# Patient Record
Sex: Female | Born: 1965 | Race: Black or African American | Hispanic: No | Marital: Single | Smoking: Former smoker
Health system: Southern US, Community
[De-identification: ages and names within clinical notes are randomized; demographics above are authoritative.]

## PROBLEM LIST (undated history)

## (undated) DIAGNOSIS — J45909 Unspecified asthma, uncomplicated: Secondary | ICD-10-CM

## (undated) DIAGNOSIS — Z21 Asymptomatic human immunodeficiency virus [HIV] infection status: Secondary | ICD-10-CM

## (undated) DIAGNOSIS — B20 Human immunodeficiency virus [HIV] disease: Secondary | ICD-10-CM

## (undated) DIAGNOSIS — T7840XA Allergy, unspecified, initial encounter: Secondary | ICD-10-CM

## (undated) DIAGNOSIS — B1920 Unspecified viral hepatitis C without hepatic coma: Secondary | ICD-10-CM

## (undated) DIAGNOSIS — E119 Type 2 diabetes mellitus without complications: Secondary | ICD-10-CM

## (undated) DIAGNOSIS — F32A Depression, unspecified: Secondary | ICD-10-CM

## (undated) DIAGNOSIS — K219 Gastro-esophageal reflux disease without esophagitis: Secondary | ICD-10-CM

## (undated) DIAGNOSIS — N189 Chronic kidney disease, unspecified: Secondary | ICD-10-CM

## (undated) DIAGNOSIS — G4730 Sleep apnea, unspecified: Secondary | ICD-10-CM

## (undated) DIAGNOSIS — M1990 Unspecified osteoarthritis, unspecified site: Secondary | ICD-10-CM

## (undated) DIAGNOSIS — F419 Anxiety disorder, unspecified: Secondary | ICD-10-CM

## (undated) DIAGNOSIS — F329 Major depressive disorder, single episode, unspecified: Secondary | ICD-10-CM

## (undated) DIAGNOSIS — G5600 Carpal tunnel syndrome, unspecified upper limb: Secondary | ICD-10-CM

## (undated) HISTORY — DX: Asymptomatic human immunodeficiency virus (hiv) infection status: Z21

## (undated) HISTORY — DX: Chronic kidney disease, unspecified: N18.9

## (undated) HISTORY — PX: SMALL BOWEL ENTEROSCOPY: SHX2415

## (undated) HISTORY — DX: Allergy, unspecified, initial encounter: T78.40XA

## (undated) HISTORY — DX: Sleep apnea, unspecified: G47.30

## (undated) HISTORY — DX: Gastro-esophageal reflux disease without esophagitis: K21.9

## (undated) HISTORY — PX: FRACTURE SURGERY: SHX138

## (undated) HISTORY — DX: Type 2 diabetes mellitus without complications: E11.9

## (undated) HISTORY — DX: Human immunodeficiency virus (HIV) disease: B20

## (undated) HISTORY — PX: ABDOMINAL HYSTERECTOMY: SHX81

## (undated) HISTORY — PX: FOOT SURGERY: SHX648

## (undated) HISTORY — PX: TONSILLECTOMY: SUR1361

## (undated) HISTORY — DX: Anxiety disorder, unspecified: F41.9

---

## 1998-10-26 ENCOUNTER — Emergency Department (HOSPITAL_COMMUNITY): Admission: EM | Admit: 1998-10-26 | Discharge: 1998-10-26 | Payer: Self-pay | Admitting: Emergency Medicine

## 1998-10-27 ENCOUNTER — Encounter: Payer: Self-pay | Admitting: Emergency Medicine

## 1998-10-27 ENCOUNTER — Emergency Department (HOSPITAL_COMMUNITY): Admission: EM | Admit: 1998-10-27 | Discharge: 1998-10-27 | Payer: Self-pay | Admitting: Emergency Medicine

## 1999-11-09 ENCOUNTER — Emergency Department (HOSPITAL_COMMUNITY): Admission: EM | Admit: 1999-11-09 | Discharge: 1999-11-09 | Payer: Self-pay | Admitting: Emergency Medicine

## 2002-06-24 ENCOUNTER — Inpatient Hospital Stay: Admission: AD | Admit: 2002-06-24 | Discharge: 2002-06-24 | Payer: Self-pay | Admitting: *Deleted

## 2003-12-28 ENCOUNTER — Emergency Department (HOSPITAL_COMMUNITY): Admission: EM | Admit: 2003-12-28 | Discharge: 2003-12-28 | Payer: Self-pay | Admitting: Emergency Medicine

## 2004-04-18 ENCOUNTER — Emergency Department (HOSPITAL_COMMUNITY): Admission: EM | Admit: 2004-04-18 | Discharge: 2004-04-18 | Payer: Self-pay | Admitting: Emergency Medicine

## 2004-11-01 ENCOUNTER — Ambulatory Visit: Payer: Self-pay | Admitting: Psychiatry

## 2004-11-01 ENCOUNTER — Inpatient Hospital Stay (HOSPITAL_COMMUNITY): Admission: RE | Admit: 2004-11-01 | Discharge: 2004-11-05 | Payer: Self-pay | Admitting: Psychiatry

## 2012-12-04 ENCOUNTER — Emergency Department (HOSPITAL_COMMUNITY): Payer: Self-pay

## 2012-12-04 ENCOUNTER — Encounter (HOSPITAL_COMMUNITY): Payer: Self-pay | Admitting: Emergency Medicine

## 2012-12-04 ENCOUNTER — Emergency Department (HOSPITAL_COMMUNITY)
Admission: EM | Admit: 2012-12-04 | Discharge: 2012-12-04 | Disposition: A | Discharge status: Home / Self Care | Payer: Self-pay | Attending: Emergency Medicine | Admitting: Emergency Medicine

## 2012-12-04 DIAGNOSIS — F17200 Nicotine dependence, unspecified, uncomplicated: Secondary | ICD-10-CM | POA: Insufficient documentation

## 2012-12-04 DIAGNOSIS — M25579 Pain in unspecified ankle and joints of unspecified foot: Secondary | ICD-10-CM | POA: Insufficient documentation

## 2012-12-04 DIAGNOSIS — M25471 Effusion, right ankle: Secondary | ICD-10-CM

## 2012-12-04 DIAGNOSIS — M25571 Pain in right ankle and joints of right foot: Secondary | ICD-10-CM

## 2012-12-04 DIAGNOSIS — M25473 Effusion, unspecified ankle: Secondary | ICD-10-CM | POA: Insufficient documentation

## 2012-12-04 DIAGNOSIS — Z79899 Other long term (current) drug therapy: Secondary | ICD-10-CM | POA: Insufficient documentation

## 2012-12-04 DIAGNOSIS — M25476 Effusion, unspecified foot: Secondary | ICD-10-CM | POA: Insufficient documentation

## 2012-12-04 MED ORDER — IBUPROFEN 800 MG PO TABS: 800.0000 mg | ORAL_TABLET | Freq: Three times a day (TID) | ORAL | Status: DC

## 2012-12-04 NOTE — ED Provider Notes (Signed)
History  This chart was scribed for non-physician practitioner Junius Finner, PA-C working with Loren Racer, MD, by Candelaria Stagers, ED Scribe. This patient was seen in room TR07C/TR07C and the patient's care was started at 11:02 PM   CSN: [id number]  Arrival date & time 12/04/12  2050   First MD Initiated Contact with Patient 12/04/12 2302      Chief Complaint  Patient presents with  . Ankle Pain   The history is provided by the patient. No language interpreter was used.   HPI Comments: Wendy Parker is a 47 y.o. female who presents to the Emergency Department complaining of right ankle swelling and pain that started earlier today.  Pt has h/o right ankle surgery 8 months ago.  Pt reports the pain is 7/10.  She reports she has been walking more than normal recently.  Bearing weight makes the pain worse.  She has taken ibuprofen with no relief.  Pt reports she has not followed up with orthopaedic surgeon.    History reviewed. No pertinent past medical history.  History reviewed. No pertinent past surgical history.  No family history on file.  History  Substance Use Topics  . Smoking status: Current Every Day Smoker  . Smokeless tobacco: Not on file  . Alcohol Use: Yes    OB History   Grav Para Term Preterm Abortions TAB SAB Ect Mult Living                  Review of Systems  Musculoskeletal: Positive for joint swelling (right ankle swelling) and arthralgias (right ankle pain).  All other systems reviewed and are negative.    Allergies  Review of patient's allergies indicates no known allergies.  Home Medications   Current Outpatient Rx  Name  Route  Sig  Dispense  Refill  . celecoxib (CELEBREX) 200 MG capsule   Oral   Take 200 mg by mouth every morning.         . DULoxetine (CYMBALTA) 60 MG capsule   Oral   Take 60 mg by mouth 2 (two) times daily.         Marland Kitchen ibuprofen (ADVIL,MOTRIN) 800 MG tablet   Oral   Take 1 tablet (800 mg total) by mouth 3  (three) times daily.   21 tablet   0     BP 146/88  Pulse 96  Temp(Src) 98.3 F (36.8 C) (Oral)  Resp 16  SpO2 98%  Physical Exam  Nursing note and vitals reviewed. Constitutional: She is oriented to person, place, and time. She appears well-developed and well-nourished. No distress.  HENT:  Head: Normocephalic and atraumatic.  Eyes: EOM are normal.  Neck: Neck supple. No tracheal deviation present.  Cardiovascular: Normal rate.   Pulmonary/Chest: Effort normal. No respiratory distress.  Musculoskeletal: Normal range of motion.  Minimal swelling of right ankle with well healed surgical scar over medial malleolus.  Mild tenderness to palpation over dorsum of foot.  Pedal pulses 2+.  No erythema or warmth.  Neurological: She is alert and oriented to person, place, and time.  Skin: Skin is warm and dry.  Psychiatric: She has a normal mood and affect. Her behavior is normal.    ED Course  Procedures   DIAGNOSTIC STUDIES: Oxygen Saturation is 98% on room air, normal by my interpretation.    COORDINATION OF CARE:  11:05 PM Discussed course of care with pt which includes antiinflammatory.  Discussed negative images with pt.  Advised pt to follow up with orthopaedist.  Pt understands and agrees.    Labs Reviewed - No data to display Dg Ankle Complete Right  12/04/2012   *RADIOLOGY REPORT*  Clinical Data: Pain, swelling.  RIGHT ANKLE - COMPLETE 3+ VIEW  Comparison: None.  Findings: Plate and screw fixation device noted in the distal right fibula.  Evidence of old injury.  There is mild diffuse soft tissue swelling.  No acute fracture, subluxation or dislocation.  Small plantar calcaneal spur.  IMPRESSION: No acute bony abnormality.   Original Report Authenticated By: Charlett Nose, M.D.     1. Right ankle swelling   2. Right ankle pain       MDM  Pt with hx of right ankle fx which required internal fixation 6mo ago c/o right ankle pain and swelling that started this morning.   Reports have "walked a lot" Has not tried anything for pain and has not contacted orthopedic surgeon.  Plain films: no acute bony abnormality.  Rx: ibuprofen,  F/u with [facility] for continued right ankle pain and swelling. Provided pt education packet on R.I.C.E tx.  I personally performed the services described in this documentation, which was scribed in my presence. The recorded information has been reviewed and is accurate.       Junius Finner, PA-C 12/05/12 1257

## 2012-12-04 NOTE — ED Notes (Signed)
PT. REPORTS RIGHT ANKLE PAIN ONSET THIS MORNING DENIES INJURY OR FALL , STATES " WALKING A LOT" , HISTORY OF RIGHT ANKLE SURGERY .

## 2012-12-06 NOTE — ED Provider Notes (Signed)
Medical screening examination/treatment/procedure(s) were performed by non-physician practitioner and as supervising physician I was immediately available for consultation/collaboration.   Silena Wyss, MD 12/06/12 0455 

## 2013-02-25 ENCOUNTER — Emergency Department (HOSPITAL_COMMUNITY)
Admission: EM | Admit: 2013-02-25 | Discharge: 2013-02-25 | Disposition: A | Discharge status: Home / Self Care | Payer: Self-pay | Attending: Emergency Medicine | Admitting: Emergency Medicine

## 2013-02-25 ENCOUNTER — Emergency Department (HOSPITAL_COMMUNITY): Payer: Self-pay

## 2013-02-25 ENCOUNTER — Encounter (HOSPITAL_COMMUNITY): Payer: Self-pay | Admitting: *Deleted

## 2013-02-25 DIAGNOSIS — F17200 Nicotine dependence, unspecified, uncomplicated: Secondary | ICD-10-CM | POA: Insufficient documentation

## 2013-02-25 DIAGNOSIS — B1920 Unspecified viral hepatitis C without hepatic coma: Secondary | ICD-10-CM | POA: Insufficient documentation

## 2013-02-25 DIAGNOSIS — R269 Unspecified abnormalities of gait and mobility: Secondary | ICD-10-CM | POA: Insufficient documentation

## 2013-02-25 DIAGNOSIS — M129 Arthropathy, unspecified: Secondary | ICD-10-CM | POA: Insufficient documentation

## 2013-02-25 DIAGNOSIS — IMO0001 Reserved for inherently not codable concepts without codable children: Secondary | ICD-10-CM | POA: Insufficient documentation

## 2013-02-25 DIAGNOSIS — M549 Dorsalgia, unspecified: Secondary | ICD-10-CM | POA: Insufficient documentation

## 2013-02-25 DIAGNOSIS — F329 Major depressive disorder, single episode, unspecified: Secondary | ICD-10-CM | POA: Insufficient documentation

## 2013-02-25 DIAGNOSIS — Z79899 Other long term (current) drug therapy: Secondary | ICD-10-CM | POA: Insufficient documentation

## 2013-02-25 DIAGNOSIS — F3289 Other specified depressive episodes: Secondary | ICD-10-CM | POA: Insufficient documentation

## 2013-02-25 DIAGNOSIS — Z8739 Personal history of other diseases of the musculoskeletal system and connective tissue: Secondary | ICD-10-CM | POA: Insufficient documentation

## 2013-02-25 DIAGNOSIS — F1210 Cannabis abuse, uncomplicated: Secondary | ICD-10-CM | POA: Insufficient documentation

## 2013-02-25 HISTORY — DX: Unspecified viral hepatitis C without hepatic coma: B19.20

## 2013-02-25 HISTORY — DX: Unspecified osteoarthritis, unspecified site: M19.90

## 2013-02-25 HISTORY — DX: Major depressive disorder, single episode, unspecified: F32.9

## 2013-02-25 HISTORY — DX: Carpal tunnel syndrome, unspecified upper limb: G56.00

## 2013-02-25 HISTORY — DX: Depression, unspecified: F32.A

## 2013-02-25 MED ORDER — HYDROCODONE-ACETAMINOPHEN 5-325 MG PO TABS: 2.0000 | ORAL_TABLET | Freq: Four times a day (QID) | ORAL | Status: DC | PRN

## 2013-02-25 MED ORDER — OXYCODONE-ACETAMINOPHEN 5-325 MG PO TABS
2.0000 | ORAL_TABLET | Freq: Once | ORAL | Status: AC
  Administered 2013-02-25: 2 via ORAL
  Filled 2013-02-25: qty 2

## 2013-02-25 MED ORDER — PROMETHAZINE HCL 25 MG PO TABS: 25.0000 mg | ORAL_TABLET | Freq: Four times a day (QID) | ORAL | Status: DC | PRN

## 2013-02-25 MED ORDER — ONDANSETRON 4 MG PO TBDP
8.0000 mg | ORAL_TABLET | Freq: Once | ORAL | Status: AC
  Administered 2013-02-25: 8 mg via ORAL
  Filled 2013-02-25: qty 2

## 2013-02-25 NOTE — ED Notes (Signed)
Several months ago pt was seen at [facility] and was told to see a "spine specialist" for lower back pain. Pt does not have insurance and has been unable to see MD.  Pain is increasing.

## 2013-02-25 NOTE — ED Provider Notes (Signed)
CSN: [id number]     Arrival date & time 02/25/13  1219 History     First MD Initiated Contact with Patient 02/25/13 1309     Chief Complaint  Patient presents with  . Back Pain    chronic   (Consider location/radiation/quality/duration/timing/severity/associated sxs/prior Treatment) HPI Comments: Patient is a 47 year old female with history of back pain who presents today with back pain gradually worsening over the past 6 months. The pain is sharp and she states she has a "lump" in her back. She has been evaluated at [facility] and they told her she needed to see a spine specialist. She has not followed up because she does not have insurance. Also spelled any prescriptions because she states she cannot afford them. She states the pain is now "crucial". The pain radiates up and down her spine. Laying makes her pain better. Standing and walking makes her pain worse. She denies bowel or bladder incontinence, history of cancer, IV drug abuse. She states she uses marijuana to help with pain.  The history is provided by the patient. No language interpreter was used.    Past Medical History  Diagnosis Date  . Carpal tunnel syndrome   . Arthritis   . Hepatitis C   . Depression    Past Surgical History  Procedure Laterality Date  . Foot surgery     No family history on file. History  Substance Use Topics  . Smoking status: Current Every Day Smoker  . Smokeless tobacco: Not on file  . Alcohol Use: Yes   OB History   Grav Para Term Preterm Abortions TAB SAB Ect Mult Living                 Review of Systems  Constitutional: Negative for fever and chills.  Respiratory: Negative for shortness of breath.   Cardiovascular: Negative for chest pain.  Gastrointestinal: Negative for nausea, vomiting and abdominal pain.  Genitourinary: Negative for difficulty urinating.  Musculoskeletal: Positive for myalgias, back pain, arthralgias and gait problem.  All other systems  reviewed and are negative.    Allergies  Review of patient's allergies indicates no known allergies.  Home Medications   Current Outpatient Rx  Name  Route  Sig  Dispense  Refill  . DULoxetine (CYMBALTA) 60 MG capsule   Oral   Take 60 mg by mouth 2 (two) times daily.         Marland Kitchen gabapentin (NEURONTIN) 300 MG capsule   Oral   Take 300 mg by mouth 3 (three) times daily.         . Multiple Vitamin (MULTIVITAMIN WITH MINERALS) TABS tablet   Oral   Take 1 tablet by mouth daily.         . naproxen (NAPROSYN) 500 MG tablet   Oral   Take 500 mg by mouth 2 (two) times daily as needed (pain).          BP 140/86  Pulse 84  Temp(Src) 98.3 F (36.8 C) (Oral)  Resp 20  SpO2 100% Physical Exam  Nursing note and vitals reviewed. Constitutional: She is oriented to person, place, and time. She appears well-developed and well-nourished. No distress.  HENT:  Head: Normocephalic and atraumatic.  Right Ear: External ear normal.  Left Ear: External ear normal.  Nose: Nose normal.  Mouth/Throat: Oropharynx is clear and moist.  Eyes: Conjunctivae are normal.  Neck: Normal range of motion.  Cardiovascular: Normal rate, regular rhythm, normal heart sounds, intact distal pulses  and normal pulses.   Pulmonary/Chest: Effort normal and breath sounds normal. No stridor. No respiratory distress. She has no wheezes. She has no rales.  Abdominal: Soft. She exhibits no distension.  Musculoskeletal: Normal range of motion.       Lumbar back: She exhibits tenderness, bony tenderness and pain.       Back:  Neurological: She is alert and oriented to person, place, and time. She has normal strength.  Skin: Skin is warm and dry. She is not diaphoretic. No erythema.  Psychiatric: She has a normal mood and affect. Her behavior is normal.    ED Course   Procedures (including critical care time)  Labs Reviewed - No data to display Dg Lumbar Spine Complete  02/25/2013   *RADIOLOGY REPORT*   Clinical Data: Abdominal pain, back pain for 5 months  LUMBAR SPINE - COMPLETE 4+ VIEW  Comparison: None.  Findings: Five views of the lumbar spine submitted.  No acute fracture or subluxation.  Surgical clips are noted in the right pelvis.  Mild disc space flattening at L5 S1 level.  Alignment and vertebral heights are preserved.  IMPRESSION: No acute fracture or subluxation.  Mild disc space flattening at L5 S1 level.   Original Report Authenticated By: Natasha Mead, M.D.   1. Back pain     MDM  Patient with back pain.  No neurological deficits and normal neuro exam.  Patient can walk but states is painful.  No loss of bowel or bladder control.  No concern for cauda equina.  No fever, night sweats, weight loss, h/o cancer, IVDU.  RICE protocol and pain medicine indicated and discussed with patient.    Mora Bellman, PA-C 02/25/13 1558

## 2013-02-28 NOTE — ED Provider Notes (Signed)
Medical screening examination/treatment/procedure(s) were performed by non-physician practitioner and as supervising physician I was immediately available for consultation/collaboration.  Candyce Churn, MD 02/28/13 [phone]

## 2013-03-07 ENCOUNTER — Ambulatory Visit: Payer: No Typology Code available for payment source | Attending: Family Medicine | Admitting: Internal Medicine

## 2013-03-07 ENCOUNTER — Encounter: Payer: Self-pay | Admitting: Internal Medicine

## 2013-03-07 VITALS — BP 133/94 | HR 77 | Temp 98.5°F | Resp 16 | Ht 67.0 in | Wt 208.4 lb

## 2013-03-07 DIAGNOSIS — M549 Dorsalgia, unspecified: Secondary | ICD-10-CM | POA: Insufficient documentation

## 2013-03-07 DIAGNOSIS — G8929 Other chronic pain: Secondary | ICD-10-CM | POA: Insufficient documentation

## 2013-03-07 DIAGNOSIS — F329 Major depressive disorder, single episode, unspecified: Secondary | ICD-10-CM | POA: Insufficient documentation

## 2013-03-07 MED ORDER — TRAMADOL HCL 50 MG PO TABS: 50.0000 mg | ORAL_TABLET | Freq: Three times a day (TID) | ORAL | Status: DC | PRN
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
## 2013-03-11 ENCOUNTER — Ambulatory Visit: Payer: No Typology Code available for payment source | Attending: Family Medicine
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
## 2013-03-12 ENCOUNTER — Telehealth: Payer: Self-pay | Admitting: Family Medicine

## 2013-03-12 NOTE — Telephone Encounter (Signed)
Pt says script for traMADol (ULTRAM) 50 MG tablet was filled at [facility] however she is unable to pick it up, due

## 2013-03-19 ENCOUNTER — Ambulatory Visit: Payer: No Typology Code available for payment source | Admitting: Physical Therapy

## 2013-03-20 ENCOUNTER — Ambulatory Visit: Payer: No Typology Code available for payment source | Attending: Internal Medicine | Admitting: Physical Therapy

## 2013-03-20 DIAGNOSIS — M545 Low back pain, unspecified: Secondary | ICD-10-CM | POA: Insufficient documentation

## 2013-03-20 DIAGNOSIS — IMO0001 Reserved for inherently not codable concepts without codable children: Secondary | ICD-10-CM | POA: Insufficient documentation

## 2013-03-26 ENCOUNTER — Telehealth: Payer: Self-pay | Admitting: Internal Medicine

## 2013-03-26 NOTE — Telephone Encounter (Signed)
Pt has been referred to [facility] and says that due to transportation issues will not be able to attend sessions and is asking why she was referred there in the first place.  She was told that she would need to go for 6 weeks but does not have transport, currently staying at [employer].

## 2013-03-27 NOTE — Telephone Encounter (Signed)
Can you confirm this with Arna Medici please. Thank you

## 2013-05-07 ENCOUNTER — Ambulatory Visit: Payer: No Typology Code available for payment source

## 2014-08-25 ENCOUNTER — Other Ambulatory Visit: Payer: Self-pay | Admitting: Family Medicine

## 2014-08-25 DIAGNOSIS — Z1231 Encounter for screening mammogram for malignant neoplasm of breast: Secondary | ICD-10-CM

## 2014-09-03 ENCOUNTER — Ambulatory Visit
Admission: RE | Admit: 2014-09-03 | Discharge: 2014-09-03 | Disposition: A | Discharge status: Home / Self Care | Payer: Medicaid Other | Source: Ambulatory Visit | Attending: Family Medicine | Admitting: Family Medicine

## 2014-09-03 DIAGNOSIS — Z1231 Encounter for screening mammogram for malignant neoplasm of breast: Secondary | ICD-10-CM

## 2014-12-27 ENCOUNTER — Emergency Department (HOSPITAL_BASED_OUTPATIENT_CLINIC_OR_DEPARTMENT_OTHER): Payer: Medicare Other

## 2014-12-27 ENCOUNTER — Encounter (HOSPITAL_COMMUNITY): Payer: Self-pay | Admitting: Emergency Medicine

## 2014-12-27 ENCOUNTER — Emergency Department (HOSPITAL_COMMUNITY)
Admission: EM | Admit: 2014-12-27 | Discharge: 2014-12-27 | Disposition: A | Discharge status: Home / Self Care | Payer: Medicare Other | Attending: Emergency Medicine | Admitting: Emergency Medicine

## 2014-12-27 DIAGNOSIS — Z720 Tobacco use: Secondary | ICD-10-CM | POA: Insufficient documentation

## 2014-12-27 DIAGNOSIS — F329 Major depressive disorder, single episode, unspecified: Secondary | ICD-10-CM | POA: Diagnosis not present

## 2014-12-27 DIAGNOSIS — Z8619 Personal history of other infectious and parasitic diseases: Secondary | ICD-10-CM | POA: Insufficient documentation

## 2014-12-27 DIAGNOSIS — Z8669 Personal history of other diseases of the nervous system and sense organs: Secondary | ICD-10-CM | POA: Diagnosis not present

## 2014-12-27 DIAGNOSIS — M1990 Unspecified osteoarthritis, unspecified site: Secondary | ICD-10-CM | POA: Diagnosis not present

## 2014-12-27 DIAGNOSIS — M79662 Pain in left lower leg: Secondary | ICD-10-CM | POA: Diagnosis present

## 2014-12-27 DIAGNOSIS — M79609 Pain in unspecified limb: Secondary | ICD-10-CM

## 2014-12-27 DIAGNOSIS — M79605 Pain in left leg: Secondary | ICD-10-CM

## 2014-12-27 DIAGNOSIS — R1032 Left lower quadrant pain: Secondary | ICD-10-CM | POA: Diagnosis not present

## 2014-12-27 MED ORDER — METHOCARBAMOL 500 MG PO TABS: 500.0000 mg | ORAL_TABLET | Freq: Two times a day (BID) | ORAL | Status: DC

## 2014-12-27 MED ORDER — HYDROMORPHONE HCL 1 MG/ML IJ SOLN
1.0000 mg | Freq: Once | INTRAMUSCULAR | Status: AC
  Administered 2014-12-27: 1 mg via INTRAMUSCULAR
  Filled 2014-12-27: qty 1

## 2014-12-27 MED ORDER — KETOROLAC TROMETHAMINE 60 MG/2ML IM SOLN
60.0000 mg | Freq: Once | INTRAMUSCULAR | Status: AC
  Administered 2014-12-27: 60 mg via INTRAMUSCULAR
  Filled 2014-12-27: qty 2

## 2014-12-27 MED ORDER — OXYCODONE-ACETAMINOPHEN 5-325 MG PO TABS: 2.0000 | ORAL_TABLET | ORAL | Status: DC | PRN

## 2014-12-27 NOTE — ED Notes (Signed)
Pt reports pain in L upper leg and calf for the past week. Denies any injury or straining. States she cannot bear weight on leg. Pt also reports URI symptoms for the past week. Pt smells strongly of marijuana.

## 2014-12-27 NOTE — Discharge Instructions (Signed)

## 2014-12-27 NOTE — Progress Notes (Signed)
VASCULAR LAB PRELIMINARY  PRELIMINARY  PRELIMINARY  PRELIMINARY  Left lower extremity venous duplex completed.    Preliminary report:  Left:  No evidence of DVT, superficial thrombosis, or Baker's cyst.  Xylia Scherger, RVT 12/27/2014, 10:52 AM

## 2014-12-27 NOTE — ED Provider Notes (Signed)
CSN: [id number]     Arrival date & time 12/27/14  0918 History   First MD Initiated Contact with Patient 12/27/14 [phone]     Chief Complaint  Patient presents with  . Leg Pain     (Consider location/radiation/quality/duration/timing/severity/associated sxs/prior Treatment) HPI Comments: Patient here complaining of 1.5 weeks of left posterior thigh pain which is now radiates to her left calf. Also complains of left groin pain. Pain characterized as sharp and worse with standing. Denies any trauma. 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 No fever or chills. Was on blood thinners in the past was unsure if she has a history of DVT. Denies any chest pain or shortness of breath. Symptoms persistent and she has used aspirin without relief.  Patient is a 49 y.o. female presenting with leg pain. The history is provided by the patient.  Leg Pain   Past Medical History  Diagnosis Date  . Carpal tunnel syndrome   . Arthritis   . Hepatitis C   . Depression    Past Surgical History  Procedure Laterality Date  . Foot surgery     History reviewed. No pertinent family history. History  Substance Use Topics  . Smoking status: Current Every Day Smoker  . Smokeless tobacco: Not on file  . Alcohol Use: Yes   OB History    No data available     Review of Systems  All other systems reviewed and are negative.     Allergies  Review of patient's allergies indicates no known allergies.  Home Medications   Prior to Admission medications   Medication Sig Start Date End Date Taking? Authorizing Provider  DULoxetine (CYMBALTA) 60 MG capsule Take 60 mg by mouth 2 (two) times daily.    Historical Provider, MD  gabapentin (NEURONTIN) 300 MG capsule Take 300 mg by mouth 3 (three) times daily.    Historical Provider, MD  HYDROcodone-acetaminophen (NORCO/VICODIN) 5-325 MG per tablet Take 2 tablets by mouth every 6 (six) hours as needed for pain. 02/25/13   Junious SilkHannah Merrell,  PA-C  Multiple Vitamin (MULTIVITAMIN WITH MINERALS) TABS tablet Take 1 tablet by mouth daily.    Historical Provider, MD  naproxen (NAPROSYN) 500 MG tablet Take 500 mg by mouth 2 (two) times daily as needed (pain).    Historical Provider, MD  promethazine (PHENERGAN) 25 MG tablet Take 1 tablet (25 mg total) by mouth every 6 (six) hours as needed for nausea. 02/25/13   Junious SilkHannah Merrell, PA-C  traMADol (ULTRAM) 50 MG tablet Take 1 tablet (50 mg total) by mouth every 8 (eight) hours as needed for pain. 03/07/13   Quentin Angstlugbemiga E Jegede, MD   BP 145/92 mmHg  Pulse 95  Temp(Src) 98.1 F (36.7 C) (Oral)  Resp 19  SpO2 99% Physical Exam  Constitutional: She is oriented to person, place, and time. She appears well-developed and well-nourished.  Non-toxic appearance. No distress.  HENT:  Head: Normocephalic and atraumatic.  Eyes: Conjunctivae, EOM and lids are normal. Pupils are equal, round, and reactive to light.  Neck: Normal range of motion. Neck supple. No tracheal deviation present. No thyroid mass present.  Cardiovascular: Normal rate, regular rhythm and normal heart sounds.  Exam reveals no gallop.   No murmur heard. Pulmonary/Chest: Effort normal and breath sounds normal. No stridor. No respiratory distress. She has no decreased breath sounds. She has no wheezes. She has no rhonchi. She has no rales.  Abdominal: Soft. Normal appearance and bowel sounds  are normal. She exhibits no distension. There is no tenderness. There is no rebound and no CVA tenderness.  Musculoskeletal: Normal range of motion. She exhibits no edema or tenderness.       Legs: Neurological: She is alert and oriented to person, place, and time. She has normal strength. No cranial nerve deficit or sensory deficit. GCS eye subscore is 4. GCS verbal subscore is 5. GCS motor subscore is 6.  Skin: Skin is warm and dry. No abrasion and no rash noted.  Psychiatric: She has a normal mood and affect. Her speech is normal and behavior is  normal.  Nursing note and vitals reviewed.   ED Course  Procedures (including critical care time) Labs Review Labs Reviewed - No data to display  Imaging Review No results found.   EKG Interpretation None      MDM   Final diagnoses:  None    Patient had Doppler of her left lower extremity that was negative for DVT. Given pain medication here feels better. Suspect musculoskeletal strain and she is stable for discharge    Lorre Nick, MD 12/27/14 1205

## 2015-04-03 ENCOUNTER — Encounter (HOSPITAL_COMMUNITY): Payer: Self-pay | Admitting: Emergency Medicine

## 2015-04-03 ENCOUNTER — Emergency Department (HOSPITAL_COMMUNITY)
Admission: EM | Admit: 2015-04-03 | Discharge: 2015-04-03 | Disposition: A | Discharge status: Home / Self Care | Payer: No Typology Code available for payment source | Attending: Emergency Medicine | Admitting: Emergency Medicine

## 2015-04-03 ENCOUNTER — Emergency Department (HOSPITAL_COMMUNITY): Payer: No Typology Code available for payment source

## 2015-04-03 DIAGNOSIS — S199XXA Unspecified injury of neck, initial encounter: Secondary | ICD-10-CM | POA: Diagnosis not present

## 2015-04-03 DIAGNOSIS — S0990XA Unspecified injury of head, initial encounter: Secondary | ICD-10-CM | POA: Diagnosis not present

## 2015-04-03 DIAGNOSIS — Y9389 Activity, other specified: Secondary | ICD-10-CM | POA: Diagnosis not present

## 2015-04-03 DIAGNOSIS — Z720 Tobacco use: Secondary | ICD-10-CM | POA: Insufficient documentation

## 2015-04-03 DIAGNOSIS — Z79899 Other long term (current) drug therapy: Secondary | ICD-10-CM | POA: Insufficient documentation

## 2015-04-03 DIAGNOSIS — F329 Major depressive disorder, single episode, unspecified: Secondary | ICD-10-CM | POA: Diagnosis not present

## 2015-04-03 DIAGNOSIS — Z8619 Personal history of other infectious and parasitic diseases: Secondary | ICD-10-CM | POA: Insufficient documentation

## 2015-04-03 DIAGNOSIS — Z8659 Personal history of other mental and behavioral disorders: Secondary | ICD-10-CM | POA: Diagnosis not present

## 2015-04-03 DIAGNOSIS — M1990 Unspecified osteoarthritis, unspecified site: Secondary | ICD-10-CM | POA: Diagnosis not present

## 2015-04-03 DIAGNOSIS — Y998 Other external cause status: Secondary | ICD-10-CM | POA: Diagnosis not present

## 2015-04-03 DIAGNOSIS — M542 Cervicalgia: Secondary | ICD-10-CM

## 2015-04-03 DIAGNOSIS — Y92410 Unspecified street and highway as the place of occurrence of the external cause: Secondary | ICD-10-CM | POA: Insufficient documentation

## 2015-04-03 MED ORDER — IBUPROFEN 800 MG PO TABS
800.0000 mg | ORAL_TABLET | Freq: Once | ORAL | Status: AC
  Administered 2015-04-03: 800 mg via ORAL
  Filled 2015-04-03: qty 1

## 2015-04-03 MED ORDER — ACETAMINOPHEN 500 MG PO TABS: 500.0000 mg | ORAL_TABLET | Freq: Four times a day (QID) | ORAL | Status: DC | PRN

## 2015-04-03 MED ORDER — IBUPROFEN 800 MG PO TABS
800.0000 mg | ORAL_TABLET | Freq: Three times a day (TID) | ORAL | Status: DC
Start: 2015-04-03 — End: 2021-10-06

## 2015-04-03 MED ORDER — ACETAMINOPHEN 325 MG PO TABS
650.0000 mg | ORAL_TABLET | Freq: Once | ORAL | Status: AC
  Administered 2015-04-03: 650 mg via ORAL
  Filled 2015-04-03: qty 2

## 2015-04-03 NOTE — Discharge Instructions (Signed)

## 2015-04-03 NOTE — ED Notes (Signed)
Pt is on the phone-unable to interview/triage
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
## 2015-08-26 ENCOUNTER — Other Ambulatory Visit: Payer: Self-pay | Admitting: Family Medicine

## 2015-08-26 DIAGNOSIS — Z1231 Encounter for screening mammogram for malignant neoplasm of breast: Secondary | ICD-10-CM

## 2015-09-07 ENCOUNTER — Ambulatory Visit
Admission: RE | Admit: 2015-09-07 | Discharge: 2015-09-07 | Disposition: A | Discharge status: Home / Self Care | Payer: Medicare HMO | Source: Ambulatory Visit | Attending: Family Medicine | Admitting: Family Medicine

## 2015-09-07 DIAGNOSIS — Z1231 Encounter for screening mammogram for malignant neoplasm of breast: Secondary | ICD-10-CM

## 2015-10-05 ENCOUNTER — Ambulatory Visit: Payer: Medicare Other

## 2016-01-20 DIAGNOSIS — K219 Gastro-esophageal reflux disease without esophagitis: Secondary | ICD-10-CM | POA: Insufficient documentation

## 2016-01-20 DIAGNOSIS — F17210 Nicotine dependence, cigarettes, uncomplicated: Secondary | ICD-10-CM | POA: Insufficient documentation

## 2016-07-28 ENCOUNTER — Other Ambulatory Visit: Payer: Self-pay | Admitting: Family Medicine

## 2016-07-28 DIAGNOSIS — Z1231 Encounter for screening mammogram for malignant neoplasm of breast: Secondary | ICD-10-CM
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
## 2016-09-08 ENCOUNTER — Ambulatory Visit: Payer: Medicare HMO

## 2016-09-09 ENCOUNTER — Ambulatory Visit
Admission: RE | Admit: 2016-09-09 | Discharge: 2016-09-09 | Disposition: A | Discharge status: Home / Self Care | Payer: Medicare Other | Source: Ambulatory Visit | Attending: Family Medicine | Admitting: Family Medicine

## 2016-09-09 DIAGNOSIS — Z1231 Encounter for screening mammogram for malignant neoplasm of breast: Secondary | ICD-10-CM

## 2017-08-15 DIAGNOSIS — G5603 Carpal tunnel syndrome, bilateral upper limbs: Secondary | ICD-10-CM | POA: Insufficient documentation

## 2017-08-22 DIAGNOSIS — M25511 Pain in right shoulder: Secondary | ICD-10-CM | POA: Insufficient documentation

## 2017-08-22 DIAGNOSIS — M7540 Impingement syndrome of unspecified shoulder: Secondary | ICD-10-CM | POA: Insufficient documentation

## 2017-08-25 ENCOUNTER — Other Ambulatory Visit: Payer: Self-pay | Admitting: Primary Care

## 2017-08-25 ENCOUNTER — Other Ambulatory Visit: Payer: Self-pay | Admitting: Internal Medicine

## 2017-08-25 DIAGNOSIS — Z1231 Encounter for screening mammogram for malignant neoplasm of breast: Secondary | ICD-10-CM

## 2017-09-18 ENCOUNTER — Other Ambulatory Visit: Payer: Self-pay | Admitting: Primary Care

## 2017-09-18 ENCOUNTER — Ambulatory Visit
Admission: RE | Admit: 2017-09-18 | Discharge: 2017-09-18 | Disposition: A | Discharge status: Home / Self Care | Payer: Medicare Other | Source: Ambulatory Visit | Attending: Primary Care | Admitting: Primary Care

## 2017-09-18 DIAGNOSIS — Z1231 Encounter for screening mammogram for malignant neoplasm of breast: Secondary | ICD-10-CM

## 2017-12-21 ENCOUNTER — Encounter: Payer: Medicare Other | Admitting: Internal Medicine

## 2017-12-25 ENCOUNTER — Encounter: Payer: Self-pay | Admitting: Infectious Diseases

## 2017-12-25 ENCOUNTER — Ambulatory Visit (INDEPENDENT_AMBULATORY_CARE_PROVIDER_SITE_OTHER): Payer: Medicare Other | Admitting: Infectious Diseases

## 2017-12-25 VITALS — BP 110/75 | HR 65 | Temp 98.2°F | Ht 67.0 in | Wt 206.0 lb

## 2017-12-25 DIAGNOSIS — Z79899 Other long term (current) drug therapy: Secondary | ICD-10-CM

## 2017-12-25 DIAGNOSIS — R768 Other specified abnormal immunological findings in serum: Secondary | ICD-10-CM

## 2017-12-25 DIAGNOSIS — Z113 Encounter for screening for infections with a predominantly sexual mode of transmission: Secondary | ICD-10-CM

## 2017-12-25 NOTE — Addendum Note (Signed)
Addended by: Ulyses SouthwardPHAM, MINH Q on: 12/25/2017 10:10 AM   Modules accepted: Orders

## 2017-12-25 NOTE — Addendum Note (Signed)
Addended by: Mariea ClontsGREEN, Shay Bartoli D on: 12/25/2017 10:19 AM   Modules accepted: Orders
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
## 2017-12-25 NOTE — Assessment & Plan Note (Signed)
She c/o stomach discomfort- she will f/u with PCP Her test is not conclusive. I explained to her that she may be immune.  Will repeat her tests as well as screen her for other STDs, viral pathogens Will see her back in 2-3 weeks.

## 2017-12-27 ENCOUNTER — Telehealth: Payer: Self-pay | Admitting: Infectious Diseases

## 2017-12-27 LAB — COMPLETE METABOLIC PANEL WITH GFR
AG RATIO: 1.8 (calc) (ref 1.0–2.5)
ALBUMIN MSPROF: 4.2 g/dL (ref 3.6–5.1)
ALT: 12 U/L (ref 6–29)
AST: 14 U/L (ref 10–35)
Alkaline phosphatase (APISO): 54 U/L (ref 33–130)
BILIRUBIN TOTAL: 0.5 mg/dL (ref 0.2–1.2)
BUN: 12 mg/dL (ref 7–25)
CHLORIDE: 108 mmol/L (ref 98–110)
CO2: 28 mmol/L (ref 20–32)
Calcium: 9.3 mg/dL (ref 8.6–10.4)
Creat: 0.8 mg/dL (ref 0.50–1.05)
GFR, EST AFRICAN AMERICAN: 98 mL/min/{1.73_m2} (ref >=60)
GFR, Est Non African American: 84 mL/min/{1.73_m2} (ref >=60)
Globulin: 2.4 g/dL (calc) (ref 1.9–3.7)
Glucose, Bld: 93 mg/dL (ref 65–99)
POTASSIUM: 4 mmol/L (ref 3.5–5.3)
SODIUM: 142 mmol/L (ref 135–146)
TOTAL PROTEIN: 6.6 g/dL (ref 6.1–8.1)

## 2017-12-27 LAB — PROTIME-INR
INR: 1.1
Prothrombin Time: 12.1 s — ABNORMAL HIGH (ref 9.0–11.5)

## 2017-12-27 NOTE — Telephone Encounter (Signed)
Called pt to let her know that her Hep C RNA was negative.  She needs no further f/u at [facility].

## 2017-12-28 LAB — FLUORESCENT TREPONEMAL AB(FTA)-IGG-BLD: FLUORESCENT TREPONEMAL ABS: NONREACTIVE

## 2017-12-28 LAB — HEPATITIS B SURFACE ANTIGEN: Hepatitis B Surface Ag: NONREACTIVE

## 2017-12-28 LAB — EXTRA LAV TOP TUBE

## 2017-12-28 LAB — HEPATITIS C ANTIBODY
Hepatitis C Ab: REACTIVE — AB
SIGNAL TO CUT-OFF: 7.64 — ABNORMAL HIGH (ref <1.00)

## 2017-12-28 LAB — RPR TITER

## 2017-12-28 LAB — HEPATITIS A ANTIBODY, TOTAL: HEPATITIS A AB,TOTAL: NONREACTIVE

## 2017-12-28 LAB — HEPATITIS C RNA QUANTITATIVE
HCV QUANT LOG: NOT DETECTED {Log_IU}/mL
HCV RNA, PCR, QN: NOT DETECTED [IU]/mL

## 2017-12-28 LAB — RPR: RPR Ser Ql: REACTIVE — AB

## 2017-12-28 LAB — HEPATITIS B SURFACE ANTIBODY,QUALITATIVE: HEP B S AB: NONREACTIVE

## 2017-12-28 LAB — EXTRA BLUE TOP TUBE

## 2017-12-28 LAB — HCV RNA,QUANTITATIVE REAL TIME PCR
HCV Quantitative Log: <1.18 Log IU/mL
HCV RNA, PCR, QN: <15 IU/mL

## 2017-12-28 LAB — HEPATITIS C GENOTYPE: HCV Genotype: NOT DETECTED

## 2017-12-28 LAB — HIV ANTIBODY (ROUTINE TESTING W REFLEX): HIV 1&2 Ab, 4th Generation: NONREACTIVE

## 2018-07-31 ENCOUNTER — Other Ambulatory Visit: Payer: Self-pay | Admitting: Primary Care

## 2018-07-31 DIAGNOSIS — Z1231 Encounter for screening mammogram for malignant neoplasm of breast: Secondary | ICD-10-CM

## 2018-09-03 ENCOUNTER — Ambulatory Visit
Admission: RE | Admit: 2018-09-03 | Discharge: 2018-09-03 | Disposition: A | Discharge status: Home / Self Care | Payer: Medicare Other | Source: Ambulatory Visit | Attending: Primary Care | Admitting: Primary Care

## 2018-09-03 DIAGNOSIS — Z1231 Encounter for screening mammogram for malignant neoplasm of breast: Secondary | ICD-10-CM

## 2019-07-31 ENCOUNTER — Other Ambulatory Visit: Payer: Self-pay | Admitting: Nurse Practitioner

## 2019-07-31 DIAGNOSIS — Z1231 Encounter for screening mammogram for malignant neoplasm of breast: Secondary | ICD-10-CM

## 2019-09-11 ENCOUNTER — Other Ambulatory Visit: Payer: Self-pay

## 2019-09-11 ENCOUNTER — Ambulatory Visit
Admission: RE | Admit: 2019-09-11 | Discharge: 2019-09-11 | Disposition: A | Discharge status: Home / Self Care | Payer: Medicare Other | Source: Ambulatory Visit | Attending: Nurse Practitioner | Admitting: Nurse Practitioner

## 2019-09-11 DIAGNOSIS — Z1231 Encounter for screening mammogram for malignant neoplasm of breast: Secondary | ICD-10-CM

## 2020-03-02 DIAGNOSIS — J4530 Mild persistent asthma, uncomplicated: Secondary | ICD-10-CM | POA: Insufficient documentation

## 2020-04-09 ENCOUNTER — Ambulatory Visit (HOSPITAL_COMMUNITY)
Admission: RE | Admit: 2020-04-09 | Discharge: 2020-04-09 | Disposition: A | Discharge status: Home / Self Care | Payer: Medicare Other | Source: Ambulatory Visit | Attending: Cardiology | Admitting: Cardiology

## 2020-04-09 ENCOUNTER — Other Ambulatory Visit (HOSPITAL_COMMUNITY): Payer: Self-pay | Admitting: Sports Medicine

## 2020-04-09 ENCOUNTER — Other Ambulatory Visit: Payer: Self-pay

## 2020-04-09 DIAGNOSIS — M79605 Pain in left leg: Secondary | ICD-10-CM

## 2020-08-03 ENCOUNTER — Other Ambulatory Visit: Payer: Self-pay | Admitting: Nurse Practitioner

## 2020-08-03 DIAGNOSIS — Z1231 Encounter for screening mammogram for malignant neoplasm of breast: Secondary | ICD-10-CM

## 2020-09-14 ENCOUNTER — Ambulatory Visit
Admission: RE | Admit: 2020-09-14 | Discharge: 2020-09-14 | Disposition: A | Discharge status: Home / Self Care | Payer: Medicare Other | Source: Ambulatory Visit | Attending: Nurse Practitioner | Admitting: Nurse Practitioner

## 2020-09-14 ENCOUNTER — Other Ambulatory Visit: Payer: Self-pay

## 2020-09-14 DIAGNOSIS — Z1231 Encounter for screening mammogram for malignant neoplasm of breast: Secondary | ICD-10-CM

## 2021-06-14 DIAGNOSIS — R140 Abdominal distension (gaseous): Secondary | ICD-10-CM | POA: Insufficient documentation

## 2021-07-07 ENCOUNTER — Other Ambulatory Visit: Payer: Self-pay | Admitting: Gastroenterology

## 2021-07-07 ENCOUNTER — Ambulatory Visit (HOSPITAL_COMMUNITY)
Admission: RE | Admit: 2021-07-07 | Discharge: 2021-07-07 | Disposition: A | Discharge status: Home / Self Care | Payer: Medicare Other | Source: Ambulatory Visit | Attending: Gastroenterology | Admitting: Gastroenterology

## 2021-07-07 ENCOUNTER — Other Ambulatory Visit: Payer: Self-pay

## 2021-07-07 ENCOUNTER — Other Ambulatory Visit (HOSPITAL_COMMUNITY): Payer: Self-pay | Admitting: Gastroenterology

## 2021-07-07 DIAGNOSIS — R1084 Generalized abdominal pain: Secondary | ICD-10-CM

## 2021-07-07 LAB — POCT I-STAT CREATININE: Creatinine, Ser: 1 mg/dL (ref 0.44–1.00)

## 2021-07-07 MED ORDER — IOHEXOL 300 MG/ML  SOLN
100.0000 mL | Freq: Once | INTRAMUSCULAR | Status: AC | PRN
  Administered 2021-07-07: 17:00:00 100 mL via INTRAVENOUS

## 2021-07-23 ENCOUNTER — Emergency Department (HOSPITAL_COMMUNITY)
Admission: EM | Admit: 2021-07-23 | Discharge: 2021-07-23 | Disposition: A | Discharge status: Home / Self Care | Payer: Medicare Other | Attending: Emergency Medicine | Admitting: Emergency Medicine

## 2021-07-23 ENCOUNTER — Emergency Department (HOSPITAL_COMMUNITY): Payer: Medicare Other

## 2021-07-23 ENCOUNTER — Other Ambulatory Visit: Payer: Self-pay

## 2021-07-23 ENCOUNTER — Encounter (HOSPITAL_COMMUNITY): Payer: Self-pay

## 2021-07-23 DIAGNOSIS — Z7982 Long term (current) use of aspirin: Secondary | ICD-10-CM | POA: Insufficient documentation

## 2021-07-23 DIAGNOSIS — R1084 Generalized abdominal pain: Secondary | ICD-10-CM | POA: Diagnosis present

## 2021-07-23 DIAGNOSIS — Z87891 Personal history of nicotine dependence: Secondary | ICD-10-CM | POA: Insufficient documentation

## 2021-07-23 DIAGNOSIS — J45909 Unspecified asthma, uncomplicated: Secondary | ICD-10-CM | POA: Diagnosis not present

## 2021-07-23 DIAGNOSIS — Z79899 Other long term (current) drug therapy: Secondary | ICD-10-CM | POA: Insufficient documentation

## 2021-07-23 HISTORY — DX: Unspecified asthma, uncomplicated: J45.909

## 2021-07-23 LAB — CBC
HCT: 43.7 % (ref 36.0–46.0)
Hemoglobin: 13.8 g/dL (ref 12.0–15.0)
MCH: 25.7 pg — ABNORMAL LOW (ref 26.0–34.0)
MCHC: 31.6 g/dL (ref 30.0–36.0)
MCV: 81.2 fL (ref 80.0–100.0)
Platelets: 237 10*3/uL (ref 150–400)
RBC: 5.38 MIL/uL — ABNORMAL HIGH (ref 3.87–5.11)
RDW: 15.6 % — ABNORMAL HIGH (ref 11.5–15.5)
WBC: 4.7 10*3/uL (ref 4.0–10.5)
nRBC: 0 % (ref 0.0–0.2)

## 2021-07-23 LAB — COMPREHENSIVE METABOLIC PANEL
ALT: 21 U/L (ref 0–44)
AST: 21 U/L (ref 15–41)
Albumin: 4.5 g/dL (ref 3.5–5.0)
Alkaline Phosphatase: 68 U/L (ref 38–126)
Anion gap: 9 (ref 5–15)
BUN: 12 mg/dL (ref 6–20)
CO2: 26 mmol/L (ref 22–32)
Calcium: 9.4 mg/dL (ref 8.9–10.3)
Chloride: 101 mmol/L (ref 98–111)
Creatinine, Ser: 0.98 mg/dL (ref 0.44–1.00)
GFR, Estimated: >60 mL/min (ref >60)
Glucose, Bld: 100 mg/dL — ABNORMAL HIGH (ref 70–99)
Potassium: 4.1 mmol/L (ref 3.5–5.1)
Sodium: 136 mmol/L (ref 135–145)
Total Bilirubin: 0.6 mg/dL (ref 0.3–1.2)
Total Protein: 8.4 g/dL — ABNORMAL HIGH (ref 6.5–8.1)

## 2021-07-23 LAB — URINALYSIS, ROUTINE W REFLEX MICROSCOPIC
Bilirubin Urine: NEGATIVE
Glucose, UA: NEGATIVE mg/dL
Hgb urine dipstick: NEGATIVE
Ketones, ur: NEGATIVE mg/dL
Leukocytes,Ua: NEGATIVE
Nitrite: NEGATIVE
Protein, ur: NEGATIVE mg/dL
Specific Gravity, Urine: <1.005 — ABNORMAL LOW (ref 1.005–1.030)
pH: 6 (ref 5.0–8.0)

## 2021-07-23 LAB — LIPASE, BLOOD: Lipase: 28 U/L (ref 11–51)

## 2021-07-23 MED ORDER — SODIUM CHLORIDE 0.9 % IV BOLUS
1000.0000 mL | Freq: Once | INTRAVENOUS | Status: AC
  Administered 2021-07-23: 15:00:00 1000 mL via INTRAVENOUS

## 2021-07-23 MED ORDER — DICYCLOMINE HCL 20 MG PO TABS: 20.0000 mg | ORAL_TABLET | Freq: Two times a day (BID) | ORAL | 0 refills | Status: DC

## 2021-07-23 MED ORDER — DICYCLOMINE HCL 10 MG PO CAPS
10.0000 mg | ORAL_CAPSULE | Freq: Once | ORAL | Status: AC
  Administered 2021-07-23: 15:00:00 10 mg via ORAL
  Filled 2021-07-23: qty 1

## 2021-07-23 MED ORDER — ONDANSETRON HCL 4 MG PO TABS: 4.0000 mg | ORAL_TABLET | Freq: Four times a day (QID) | ORAL | 0 refills | Status: DC

## 2021-07-23 MED ORDER — ONDANSETRON HCL 4 MG/2ML IJ SOLN
4.0000 mg | Freq: Once | INTRAMUSCULAR | Status: AC
  Administered 2021-07-23: 15:00:00 4 mg via INTRAVENOUS
  Filled 2021-07-23: qty 2

## 2021-07-23 MED ORDER — IOHEXOL 350 MG/ML SOLN
100.0000 mL | Freq: Once | INTRAVENOUS | Status: AC | PRN
  Administered 2021-07-23: 11:00:00 100 mL via INTRAVENOUS

## 2021-07-23 MED ORDER — SODIUM CHLORIDE (PF) 0.9 % IJ SOLN
INTRAMUSCULAR | Status: AC
  Filled 2021-07-23: qty 50

## 2021-07-23 NOTE — Progress Notes (Signed)
20 g IV placed in left [facility], patient advised if she should to decide to leave without being seen to notify the nursing staff.  So that they remove the IV

## 2021-07-23 NOTE — Discharge Instructions (Signed)
Return for any problem.  ?

## 2021-07-23 NOTE — ED Provider Notes (Signed)
[facility]-EMERGENCY DEPT Provider Note   CSN: [id number] Arrival date & time: 07/23/21  4098     History Chief Complaint  Patient presents with   Abdominal Pain    Wendy Parker is a 55 y.o. female.  55 year old female with prior medical history as detailed below presents for evaluation.  Patient with longstanding history of chronic abdominal pain.  She reports feeling constipated for the last 2 weeks.  She reports that her last bowel movement was roughly 1 week ago.  She reports that she is able to pass gas.  She denies associated vomiting.  She reports intermittent nausea.  She reports that she is able to take her regular narcotics.  Other medications - including Linzess - are tolerated by the patient per her report.  The history is provided by the patient.  Abdominal Pain Pain location:  Generalized Pain quality: aching and cramping   Pain radiates to:  Does not radiate Pain severity:  Mild Onset quality:  Gradual Duration:  3 weeks Timing:  Constant Progression:  Waxing and waning Chronicity:  Chronic     Past Medical History:  Diagnosis Date   Arthritis    Asthma    Carpal tunnel syndrome    Depression    Hepatitis C     Patient Active Problem List   Diagnosis Date Noted   Hepatitis C antibody test positive 12/25/2017   Major depression 03/07/2013   Chronic back pain 03/07/2013    Past Surgical History:  Procedure Laterality Date   ABDOMINAL HYSTERECTOMY     FOOT SURGERY     SMALL BOWEL ENTEROSCOPY     TONSILLECTOMY       OB History   No obstetric history on file.     Family History  Problem Relation Age of Onset   Diabetes Mother    Diabetes Father    Cancer Father    Diabetes Sister     Social History   Tobacco Use   Smoking status: Former    Packs/day: 1.00    Types: Cigarettes    Start date: 07/25/1973   Smokeless tobacco: Never  Vaping Use   Vaping Use: Never used  Substance Use Topics   Alcohol use:  Yes    Alcohol/week: 6.0 standard drinks    Types: 6 Cans of beer per week   Drug use: Not Currently    Types: Marijuana    Home Medications Prior to Admission medications   Medication Sig Start Date End Date Taking? Authorizing Provider  acetaminophen (TYLENOL) 500 MG tablet Take 1 tablet (500 mg total) by mouth every 6 (six) hours as needed. 04/03/15   Cheri Fowler, PA-C  Aspirin-Caffeine [phone] MG PACK Take 1 packet by mouth daily as needed (for pain).    [provider]  DULoxetine (CYMBALTA) 60 MG capsule Take 60 mg by mouth 2 (two) times daily.    [provider]  gabapentin (NEURONTIN) 300 MG capsule Take 300 mg by mouth 3 (three) times daily.    [provider]  HYDROcodone-acetaminophen (NORCO/VICODIN) 5-325 MG per tablet Take 2 tablets by mouth every 6 (six) hours as needed for pain. Patient not taking: Reported on 12/27/2014 02/25/13   Junious Silk, PA-C  ibuprofen (ADVIL,MOTRIN) 800 MG tablet Take 1 tablet (800 mg total) by mouth 3 (three) times daily. 04/03/15   Cheri Fowler, PA-C  methocarbamol (ROBAXIN) 500 MG tablet Take 1 tablet (500 mg total) by mouth 2 (two) times daily. Patient not taking: Reported  on 04/03/2015 12/27/14   Lorre Nick, MD  Oxycodone HCl 10 MG TABS Take 10 mg by mouth 2 (two) times daily as needed (PAIN).  03/17/15   [provider]  oxyCODONE-acetaminophen (PERCOCET/ROXICET) 5-325 MG per tablet Take 2 tablets by mouth every 4 (four) hours as needed for severe pain. Patient not taking: Reported on 04/03/2015 12/27/14   Lorre Nick, MD  promethazine (PHENERGAN) 25 MG tablet Take 1 tablet (25 mg total) by mouth every 6 (six) hours as needed for nausea. Patient not taking: Reported on 12/27/2014 02/25/13   Junious Silk, PA-C  RESTASIS 0.05 % ophthalmic emulsion Place 1 drop into both eyes 2 (two) times daily. 03/02/15   [provider]  traMADol (ULTRAM) 50 MG tablet Take 1 tablet (50 mg total) by mouth every 8 (eight) hours as  needed for pain. Patient not taking: Reported on 12/27/2014 03/07/13   Quentin Angst, MD    Allergies    Tylenol [acetaminophen]  Review of Systems   Review of Systems  Gastrointestinal:  Positive for abdominal pain.  All other systems reviewed and are negative.  Physical Exam Updated Vital Signs BP (!) 146/96 (BP Location: Right Wrist)    Pulse (!) 104    Temp 98.7 F (37.1 C) (Oral)    Resp 19    Ht 5\' 7"  (1.702 m)    Wt 133.8 kg    SpO2 99%    BMI 46.20 kg/m   Physical Exam Vitals and nursing note reviewed.  Constitutional:      General: She is not in acute distress.    Appearance: Normal appearance. She is well-developed.  HENT:     Head: Normocephalic and atraumatic.  Eyes:     Conjunctiva/sclera: Conjunctivae normal.     Pupils: Pupils are equal, round, and reactive to light.  Cardiovascular:     Rate and Rhythm: Normal rate and regular rhythm.     Heart sounds: Normal heart sounds.  Pulmonary:     Effort: Pulmonary effort is normal. No respiratory distress.     Breath sounds: Normal breath sounds.  Abdominal:     General: There is no distension.     Palpations: Abdomen is soft.     Tenderness: There is no abdominal tenderness.  Musculoskeletal:        General: No deformity. Normal range of motion.     Cervical back: Normal range of motion and neck supple.  Skin:    General: Skin is warm and dry.  Neurological:     General: No focal deficit present.     Mental Status: She is alert and oriented to person, place, and time.    ED Results / Procedures / Treatments   Labs (all labs ordered are listed, but only abnormal results are displayed) Labs Reviewed  COMPREHENSIVE METABOLIC PANEL - Abnormal; Notable for the following components:      Result Value   Glucose, Bld 100 (*)    Total Protein 8.4 (*)    All other components within normal limits  CBC - Abnormal; Notable for the following components:   RBC 5.38 (*)    MCH 25.7 (*)    RDW 15.6 (*)    All  other components within normal limits  LIPASE, BLOOD  URINALYSIS, ROUTINE W REFLEX MICROSCOPIC    EKG None  Radiology CT ABDOMEN PELVIS W CONTRAST  Result Date: 07/23/2021 CLINICAL DATA:  Mid abdominal pain for several months. EXAM: CT ABDOMEN AND PELVIS WITH CONTRAST TECHNIQUE: Multidetector CT  imaging of the abdomen and pelvis was performed using the standard protocol following bolus administration of intravenous contrast. CONTRAST:  OMNIPAQUE IOHEXOL 350 MG/ML SOLN COMPARISON:  July 07, 2021. FINDINGS: Lower chest: No acute abnormality. Hepatobiliary: No focal liver abnormality is seen. No gallstones, gallbladder wall thickening, or biliary dilatation. Pancreas: Unremarkable. No pancreatic ductal dilatation or surrounding inflammatory changes. Spleen: Normal in size without focal abnormality. Adrenals/Urinary Tract: Adrenal glands are unremarkable. Kidneys are normal, without renal calculi, focal lesion, or hydronephrosis. Bladder is unremarkable. Stomach/Bowel: The stomach appears normal. There is no evidence of bowel obstruction or inflammation. Sigmoid diverticulosis is noted without inflammation. Vascular/Lymphatic: Aortic atherosclerosis. No enlarged abdominal or pelvic lymph nodes. Reproductive: Status post hysterectomy. No adnexal masses. Other: No abdominal wall hernia or abnormality. No abdominopelvic ascites. Musculoskeletal: No acute or significant osseous findings. IMPRESSION: No acute abnormality seen in the abdomen or pelvis. Aortic Atherosclerosis (ICD10-I70.0). Electronically Signed   By: Lupita Raider M.D.   On: 07/23/2021 11:17    Procedures Procedures   Medications Ordered in ED Medications  sodium chloride 0.9 % bolus 1,000 mL (has no administration in time range)  ondansetron (ZOFRAN) injection 4 mg (has no administration in time range)  dicyclomine (BENTYL) capsule 10 mg (has no administration in time range)  iohexol (OMNIPAQUE) 350 MG/ML injection 100 mL  (100 mLs Intravenous Contrast Given 07/23/21 1053)  sodium chloride (PF) 0.9 % injection (  Given by Other 07/23/21 1403)    ED Course  I have reviewed the triage vital signs and the nursing notes.  Pertinent labs & imaging results that were available during my care of the patient were reviewed by me and considered in my medical decision making (see chart for details).    MDM Rules/Calculators/A&P                         MDM  MSE complete  Lianah Peed Dials was evaluated in Emergency Department on 07/23/2021 for the symptoms described in the history of present illness. She was evaluated in the context of the global COVID-19 pandemic, which necessitated consideration that the patient might be at risk for infection with the SARS-CoV-2 virus that causes COVID-19. Institutional protocols and algorithms that pertain to the evaluation of patients at risk for COVID-19 are in a state of rapid change based on information released by regulatory bodies including the CDC and federal and state organizations. These policies and algorithms were followed during the patient's care in the ED.       4:10 PM Reevaluation with update and discussion with patient.  Patient feels significantly improved.  She does understand need for close outpatient management and follow-up.  Wynetta Fines    Medical Decision Making: Summary: Patient presented with complaint of acute on chronic abdominal discomfort and pain.  Patient is on longstanding oral narcotics for treatment of chronic abdominal pain.  She also takes Linzess.  She reported increased abdominal pain over the last 1 to 2 weeks.  Critical Interventions-history, exam, labs and imaging; to evaluate  Chief Complaint  Patient presents with   Abdominal Pain    and assess for illness characterized as Acute, Chronic, Uncertain Prognosis, Complicated, Systemic Symptoms, and Threat to Life/Bodily Function   After These Interventions, the Patient was reevaluated  and was found to be improved  This patient is presenting for evaluation of acute on chronic abdominal pain, which does require a range of treatment options, and is a complaint that involves  a moderate risk of morbidity and mortality.  The differential diagnoses include bowel obstruction, intra-abdominal infection, other intra-abdominal pathology.  I reviewed prior data available in the Epic system and patient with longstanding well established history of chronic abdominal discomfort..    Clinical Laboratory Tests Ordered, included CBC, Metabolic panel, Urinalysis, and CT . Review indicates no significant abnormality.   Radiologic Tests Ordered, included CT AP.  I independently Visualized: CT Images, which show no acute abnormality.   Patient improved with administration of Bentyl, Zofran, and IV fluids.  She is appropriate for discharge.   Patient feels improved after ED evaluation today.  She does understand need for close follow-up in the outpatient setting.  Strict return precautions given and understood.   Final Clinical Impression(s) / ED Diagnoses Final diagnoses:  Generalized abdominal pain    Rx / DC Orders ED Discharge Orders          Ordered    ondansetron (ZOFRAN) 4 MG tablet  Every 6 hours        07/23/21 1619    dicyclomine (BENTYL) 20 MG tablet  2 times daily        07/23/21 1619             Wynetta Fines, MD 07/23/21 1630

## 2021-07-23 NOTE — ED Provider Notes (Signed)
Emergency Medicine Provider Triage Evaluation Note  Wendy Parker , a 55 y.o. female  was evaluated in triage.  Pt complains of abdominal pain and fullness.  Patient states she has not had a bowel movement in 2 weeks.  She has been having chronic abdominal pain without known etiology.  She had a CT done on 07/07/2021.  She notes a history of bowel blockage.  She is due the get her colonoscopy done next month.  She is also on chronic pain medication including oxycodone.  She also feels nauseous.  She denies fevers, chills, chest pain  Review of Systems  As above  Physical Exam  BP (!) 159/105 (BP Location: Left Arm)    Pulse (!) 121    Temp 98.6 F (37 C) (Oral)    Resp 20    Ht 5\' 7"  (1.702 m)    Wt 133.8 kg    SpO2 98%    BMI 46.20 kg/m  Gen:   Awake, moaning in pain  Resp:  Normal effort  MSK:   Moves extremities without difficulty  Other:  Abdomen is distended, TTP in epigastric   Medical Decision Making  Medically screening exam initiated at 8:48 AM.  Appropriate orders placed.  Wendy Parker was informed that the remainder of the evaluation will be completed by another provider, this initial triage assessment does not replace that evaluation, and the importance of remaining in the ED until their evaluation is complete.     , Janell Quiet 07/23/21 07/25/21    2233, MD 07/23/21 [phone]

## 2021-07-23 NOTE — ED Triage Notes (Signed)
Patient c/o intermittent mid abdominal pain. Patient reports a history of bowel blockage. Patient states her last BM was 2 weeks ago. Patient states, "I take a lot of oxy's." Patient also c/o nausea.

## 2021-08-11 ENCOUNTER — Other Ambulatory Visit: Payer: Self-pay | Admitting: Nurse Practitioner

## 2021-08-11 DIAGNOSIS — Z1231 Encounter for screening mammogram for malignant neoplasm of breast: Secondary | ICD-10-CM

## 2021-09-15 ENCOUNTER — Ambulatory Visit: Payer: Medicare Other

## 2021-09-16 ENCOUNTER — Other Ambulatory Visit: Payer: Self-pay

## 2021-09-16 ENCOUNTER — Ambulatory Visit
Admission: RE | Admit: 2021-09-16 | Discharge: 2021-09-16 | Disposition: A | Discharge status: Home / Self Care | Payer: Medicare Other | Source: Ambulatory Visit | Attending: Nurse Practitioner | Admitting: Nurse Practitioner

## 2021-09-16 DIAGNOSIS — Z1231 Encounter for screening mammogram for malignant neoplasm of breast: Secondary | ICD-10-CM

## 2021-09-21 ENCOUNTER — Telehealth: Payer: Self-pay | Admitting: Internal Medicine

## 2021-09-21 NOTE — Telephone Encounter (Signed)
erroe

## 2021-09-21 NOTE — Telephone Encounter (Signed)
Good morning, Dr. Lorenso Courier,  This patient came in and brought by her most recent EGD/colonoscopy.  She is very dissatisfied with the doctor she saw, said he was extremely rude, and did not explain what is going on with her and why he put her on the medications he prescribed for her.  She is requesting a transfer of care.  Her records are being sent to you through interoffice mail.  Please let me know if you approve her transfer.    Thank you.

## 2021-09-24 NOTE — Telephone Encounter (Signed)
Hey Dr. Leonides Schanz,  ? ?Patient called in states she need to be seen for abd pain. Please advise? ?

## 2021-10-06 ENCOUNTER — Ambulatory Visit (INDEPENDENT_AMBULATORY_CARE_PROVIDER_SITE_OTHER): Payer: Medicare Other | Admitting: Physician Assistant

## 2021-10-06 ENCOUNTER — Encounter: Payer: Self-pay | Admitting: Physician Assistant

## 2021-10-06 VITALS — BP 122/80 | HR 94 | Ht 67.0 in | Wt 285.0 lb

## 2021-10-06 DIAGNOSIS — K219 Gastro-esophageal reflux disease without esophagitis: Secondary | ICD-10-CM | POA: Diagnosis not present

## 2021-10-06 DIAGNOSIS — R1013 Epigastric pain: Secondary | ICD-10-CM

## 2021-10-06 DIAGNOSIS — R1011 Right upper quadrant pain: Secondary | ICD-10-CM | POA: Diagnosis not present

## 2021-10-06 DIAGNOSIS — K5909 Other constipation: Secondary | ICD-10-CM | POA: Diagnosis not present

## 2021-10-06 NOTE — Patient Instructions (Signed)
If you are age 57 or younger, your body mass index should be between 19-25. Your Body mass index is 44.64 kg/m?Marland Kitchen If this is out of the aformentioned range listed, please consider follow up with your Primary Care Provider.  ?________________________________________________________ ? ?The [facility] providers would like to encourage you to use [facility] to communicate with providers for non-urgent requests or questions.  Due to long hold times on the telephone, sending your provider a message by [facility] may be a faster and more efficient way to get a response.  Please allow 48 business hours for a response.  Please remember that this is for non-urgent requests.  ?_______________________________________________________ ? ?CONTINUE: Omeprazole 40mg  daily and Linzess 145mg  daily ? ?We will attempt to obtain records from [facility] office. ? ?You will need a follow up appointment in 3 months (June 2023).  We will contact you to schedule this appointment. ? ?Thank you for entrusting me with your care and choosing [facility]. ? ?11-26-1984, PA-C ?

## 2021-10-06 NOTE — Progress Notes (Addendum)
? ?Chief Complaint: Abdominal pain ? ?HPI: ?   Wendy Parker is a 56 year old African-American female with past medical history as listed below including depression, previously followed with Dr. Elnoria Howard, who was referred to me by Filomena Jungling, NP for a complaint of abdominal pain. ?   06/30/2021 patient seen by PCP and discussed abdominal discomfort and fullness and that her bowel movements have not changed.  This pain is located near her bellybutton.  Apparently had surgery in this area some years back for abdominal problems.  At that time on Oxycodone 10 mg every 6 hours as needed and Senna as well as Linzess 145 mcg, they ordered an x-ray of the abdomen. ?   07/23/2021 seen in the ED for abdominal pain with CT then pelvis with contrast for mid abdominal pain for several months with no acute abnormality in the abdomen or pelvis.  Labs at that time with a glucose of 100 and otherwise normal CMP and CBC.  Lipase normal.  She was given Bentyl and Zofran. ?   08/09/2021 patient seen again for abdominal pain.  She was given Dicyclomine 10 mg 4 times daily and Rybelsus was continued. ?   Today, the patient explains that she started with mostly right upper quadrant discomfort and abdominal fullness in December, she eventually went to the ED and had a CT which was unrevealing and labs which were unrevealing.  She was started on Bentyl and Zofran at that time and continue to follow with her PCP who then referred her to a GI doctor, Dr. Elnoria Howard, he evaluated her with EGD and colonoscopy in late February.  She brings those records with her but they are only partial records and show esophagitis and melanosis coli, otherwise there are no other recommendations.  Patient tells me she was then started on Omeprazole 40 mg daily which she has continued as it was helpful and has decreased her pain from an 8-9/10 to more of a 2-3/10 which still occurs within 10 to 15 minutes of eating.  Tells me he also started her on a Methylprednisolone  pack of which she took 6 days but felt very bloated and her face became full so she discontinued this altogether.  Tells me she was also told her weight is likely contributing to her symptoms.  She wanted to get a second opinion. ?   Currently, she is feeling some better, about 75% better but does continue with some right upper quadrant discomfort which seems worse immediately after eating and worse after eating things such as red meats.  Also has chronic constipation for which she is using Linzess 145 mcg daily which does help her to have regular bowel movements.  Also tells me that her Oxycodone was decreased to just 1 tab a day recently as they thought this may be contributing to her symptoms. ?   Denies fever, chills, weight loss, blood in her stool or symptoms that awaken her from sleep. ?   ?Past Medical History:  ?Diagnosis Date  ? Arthritis   ? Asthma   ? Carpal tunnel syndrome   ? Depression   ? Hepatitis C   ? ? ?Past Surgical History:  ?Procedure Laterality Date  ? ABDOMINAL HYSTERECTOMY    ? FOOT SURGERY    ? SMALL BOWEL ENTEROSCOPY    ? TONSILLECTOMY    ? ? ?Allergies as of 10/06/2021   ? ?   Reactions  ? Tylenol [acetaminophen]   ? ?  ? ?  ?Medication List  ?  ? ?  ?  Accurate as of October 06, 2021  1:17 PM. If you have any questions, ask your nurse or doctor.  ?  ?  ? ?  ? ?STOP taking these medications   ? ?acetaminophen 500 MG tablet ?Commonly known as: TYLENOL ?Stopped by: Unk Lightning, PA ?  ?Aspirin-Caffeine 845-65 MG Pack ?Stopped by: Unk Lightning, PA ?  ?dicyclomine 20 MG tablet ?Commonly known as: BENTYL ?Stopped by: Unk Lightning, PA ?  ?DULoxetine 60 MG capsule ?Commonly known as: CYMBALTA ?Stopped by: Unk Lightning, PA ?  ?gabapentin 300 MG capsule ?Commonly known as: NEURONTIN ?Stopped by: Unk Lightning, PA ?  ?HYDROcodone-acetaminophen 5-325 MG tablet ?Commonly known as: NORCO/VICODIN ?Stopped by: Unk Lightning, PA ?  ?ibuprofen 800 MG  tablet ?Commonly known as: ADVIL ?Stopped by: Unk Lightning, PA ?  ?methocarbamol 500 MG tablet ?Commonly known as: ROBAXIN ?Stopped by: Unk Lightning, PA ?  ?ondansetron 4 MG tablet ?Commonly known as: ZOFRAN ?Stopped by: Unk Lightning, PA ?  ?oxyCODONE-acetaminophen 5-325 MG tablet ?Commonly known as: PERCOCET/ROXICET ?Stopped by: Unk Lightning, PA ?  ?promethazine 25 MG tablet ?Commonly known as: PHENERGAN ?Stopped by: Unk Lightning, PA ?  ?Restasis 0.05 % ophthalmic emulsion ?Generic drug: cycloSPORINE ?Stopped by: Unk Lightning, PA ?  ?traMADol 50 MG tablet ?Commonly known as: ULTRAM ?Stopped by: Unk Lightning, PA ?  ? ?  ? ?TAKE these medications   ? ?Linzess 145 MCG Caps capsule ?Generic drug: linaclotide ?Take 145 mcg by mouth daily. ?  ?omeprazole 40 MG capsule ?Commonly known as: PRILOSEC ?Take 40 mg by mouth daily. ?  ?Oxycodone HCl 10 MG Tabs ?Take 10 mg by mouth 2 (two) times daily as needed (PAIN). ?  ? ?  ?  ? ? ?Allergies as of 10/06/2021 - Review Complete 07/23/2021  ?Allergen Reaction Noted  ? Tylenol [acetaminophen]  07/23/2021  ? ? ?Family History  ?Problem Relation Age of Onset  ? Diabetes Mother   ? Diabetes Father   ? Cancer Father   ? Diabetes Sister   ? ? ?Social History  ? ?Socioeconomic History  ? Marital status: Single  ?  Spouse name: Not on file  ? Number of children: Not on file  ? Years of education: Not on file  ? Highest education level: Not on file  ?Occupational History  ? Not on file  ?Tobacco Use  ? Smoking status: Former  ?  Packs/day: 1.00  ?  Types: Cigarettes  ?  Start date: 07/25/1973  ? Smokeless tobacco: Never  ?Vaping Use  ? Vaping Use: Never used  ?Substance and Sexual Activity  ? Alcohol use: Yes  ?  Alcohol/week: 6.0 standard drinks  ?  Types: 6 Cans of beer per week  ? Drug use: Not Currently  ?  Types: Marijuana  ? Sexual activity: Yes  ?  Partners: Female  ?Other Topics Concern  ? Not on file  ?Social History  Narrative  ? Not on file  ? ?Social Determinants of Health  ? ?Financial Resource Strain: Not on file  ?Food Insecurity: Not on file  ?Transportation Needs: Not on file  ?Physical Activity: Not on file  ?Stress: Not on file  ?Social Connections: Not on file  ?Intimate Partner Violence: Not on file  ? ? ?Review of Systems:    ?Constitutional: No weight loss, fever or chills ?Skin: No rash ?Cardiovascular: No chest pain ?Respiratory: No SOB  ?Gastrointestinal: See HPI and otherwise negative ?Genitourinary:  No dysuria ?Neurological: No headache, dizziness or syncope ?Musculoskeletal: No new muscle or joint pain ?Hematologic: No bleeding  ?Psychiatric: No history of depression or anxiety ? ? Physical Exam:  ?Vital signs: ?BP 122/80   Pulse 94   Ht 5\' 7"  (1.702 m)   Wt 285 lb (129.3 kg)   BMI 44.64 kg/m?   ? ?Constitutional:   Pleasant obese AA female appears to be in NAD, Well developed, Well nourished, alert and cooperative ?Head:  Normocephalic and atraumatic. ?Eyes:   PEERL, EOMI. No icterus. Conjunctiva pink. ?Ears:  Normal auditory acuity. ?Neck:  Supple ?Throat: Oral cavity and pharynx without inflammation, swelling or lesion.  ?Respiratory: Respirations even and unlabored. Lungs clear to auscultation bilaterally.   No wheezes, crackles, or rhonchi.  ?Cardiovascular: Normal S1, S2. No MRG. Regular rate and rhythm. No peripheral edema, cyanosis or pallor.  ?Gastrointestinal:  Soft, nondistended, Mild RUQ /epigastric ttp, No rebound or guarding. Normal bowel sounds. No appreciable masses or hepatomegaly. ?Rectal:  Not performed.  ?Msk:  Symmetrical without gross deformities. Without edema, no deformity or joint abnormality.  ?Neurologic:  Alert and  oriented x4;  grossly normal neurologically.  ?Skin:   Dry and intact without significant lesions or rashes. ?Psychiatric: Demonstrates good judgement and reason without abnormal affect or behaviors. ? ?RELEVANT LABS AND IMAGING: ?CBC ?   ?Component Value Date/Time   ? WBC 4.7 07/23/2021 0855  ? RBC 5.38 (H) 07/23/2021 0855  ? HGB 13.8 07/23/2021 0855  ? HCT 43.7 07/23/2021 0855  ? PLT 237 07/23/2021 0855  ? MCV 81.2 07/23/2021 0855  ? MCH 25.7 (L) 07/23/2021 0855  ? MCHC 31.6

## 2021-10-17 NOTE — Progress Notes (Signed)
I agree with assessment and plan as outlined by Ms. Lemmon. ?

## 2021-11-30 ENCOUNTER — Telehealth: Payer: Self-pay | Admitting: *Deleted

## 2021-11-30 NOTE — Telephone Encounter (Signed)
Called [employer] [facility] to get access to her [employer] account so we can get her settings and see past compliance.  Also requested a copy of her sleep study from  09/12/2018.  Provided fax# and asked that it be put to the attention to Tammy.  She stated she would fax the sleep study, however she would have the pap team call back regarding getting access to her airview account.  When they return call, we just need access to her airview account to see settings, ect. ?

## 2021-11-30 NOTE — Telephone Encounter (Signed)
Called and spoke with patient, she states she has a sleep study through [facility] about a year ago and was started on a CPAP machine, however, she returned it.  She was using [facility] at the time.  She was wanting to get the CPAP, but was told that she had to get another study.  Advised that a sleep study would be discussed with the provider and she would like likely have to do another study.  She verbalized understanding.  Nothing further needed. ?

## 2021-12-01 ENCOUNTER — Encounter: Payer: Self-pay | Admitting: Adult Health

## 2021-12-01 ENCOUNTER — Ambulatory Visit (INDEPENDENT_AMBULATORY_CARE_PROVIDER_SITE_OTHER): Payer: Medicare Other | Admitting: Adult Health

## 2021-12-01 VITALS — BP 124/80 | HR 80 | Temp 98.3°F | Ht 67.0 in | Wt 288.4 lb

## 2021-12-01 DIAGNOSIS — R0683 Snoring: Secondary | ICD-10-CM

## 2021-12-01 DIAGNOSIS — Z6841 Body Mass Index (BMI) 40.0 and over, adult: Secondary | ICD-10-CM

## 2021-12-01 DIAGNOSIS — R400 Somnolence: Secondary | ICD-10-CM | POA: Diagnosis not present

## 2021-12-01 DIAGNOSIS — G4733 Obstructive sleep apnea (adult) (pediatric): Secondary | ICD-10-CM

## 2021-12-01 NOTE — Progress Notes (Signed)
? ?@Patient  ID: Wendy Parker, female    DOB: 1966-03-22, 56 y.o.   MRN: [id number] ? ?Chief Complaint  ?Patient presents with  ? Consult  ? ? ?Referring provider: ?Armanda Heritage, NP ? ?HPI: ?56 year old female seen for sleep consult Dec 01, 2021 to establish for sleep apnea ?Medical history with chronic pain from joint pain/arthritis , Hep C , DM  ? ?TEST/EVENTS :  ? ?12/01/2021 Sleep consult  ?Patient presents today for a sleep consult.  She was kindly referred by her primary care provider Armanda Heritage, NP.  Patient says that she has been diagnosed with sleep apnea in the past.  Had CPAP for about a year but did not wear that much. Turned machine back in . Complains that she has bad snoring , restless sleep , daytime sleepiness. Wakes up tired all the time.  Typically goes to bed about 10 PM.  Takes about an hour to go to sleep.  Usually has still use a sleep aid to go to sleep. Uses Oxycodone to help her sleep.  Is up about 2-3 times each night.  Typically gets up about 6 to 7 AM.  Does not operate heavy machinery for work.  Weight has been pretty steady over the last 2 years.  Current weight is at 288 pounds with a BMI of 45.  Patient has had a sleep study in the past but results were not available at today's visit..  No symptoms suspicious for cataplexy or sleep paralysis.  Previously used [employer] care DME. ?Epworth score is 6 out of 24.  Typically gets sleepy if she is sitting still watching TV or in the afternoon hours. No history of CHF or CVA.  ?Can not do in lab sleep study. Lives alone and has a dog -no one can watch him .  ? ?Medical history significant for , diabetes, hyperlipidemia, chronic allergic rhinitis, Hep C, depression, intermittent asthma, GERD ? ?Social history patient is single.  Is disabled-Arthritis .  Lives by herself.  No children.  Quit smoking 2022.  No alcohol or drugs since 2022. Hx of marajuana use , none since 2022.  ? ?Family history positive for asthma and allergies,  diabetes ? ?Surgical history previous hysterectomy, tonsillectomy and foot surgery ? ?Allergies  ?Allergen Reactions  ? Tylenol [Acetaminophen]   ? ? ?Immunization History  ?Administered Date(s) Administered  ? Pneumococcal Polysaccharide-23 09/14/2016  ? ? ?Past Medical History:  ?Diagnosis Date  ? Arthritis   ? Asthma   ? Carpal tunnel syndrome   ? Depression   ? Hepatitis C   ? ? ?Tobacco History: ?Social History  ? ?Tobacco Use  ?Smoking Status Former  ? Packs/day: 1.00  ? Types: Cigarettes  ? Start date: 07/25/1973  ?Smokeless Tobacco Never  ? ?Counseling given: Not Answered ? ? ?Outpatient Medications Prior to Visit  ?Medication Sig Dispense Refill  ? albuterol (VENTOLIN HFA) 108 (90 Base) MCG/ACT inhaler As needed    ? atorvastatin (LIPITOR) 40 MG tablet Take by mouth. daily    ? BELBUCA 75 MCG FILM Take 1 strip by mouth 2 (two) times daily.    ? LINZESS 145 MCG CAPS capsule Take 145 mcg by mouth daily.    ? Multiple Vitamin (MULTIVITAMIN ADULT PO) Multivitamin    ? omeprazole (PRILOSEC) 40 MG capsule Take 40 mg by mouth daily.    ? Oxycodone HCl 10 MG TABS Take 10 mg by mouth 2 (two) times daily as needed (PAIN).  (Patient not taking: Reported on 12/01/2021)    ? ?  No facility-administered medications prior to visit.  ? ? ? ?Review of Systems:  ? ?Constitutional:   No  weight loss, night sweats,  Fevers, chills, f ?+atigue, or  lassitude. ? ?HEENT:   No headaches,  Difficulty swallowing,  Tooth/dental problems, or  Sore throat,  ?              No sneezing, itching, ear ache, nasal congestion, post nasal drip,  ? ?CV:  No chest pain,  Orthopnea, PND, swelling in lower extremities, anasarca, dizziness, palpitations, syncope.  ? ?GI  No heartburn, indigestion, abdominal pain, nausea, vomiting, diarrhea, change in bowel habits, loss of appetite, bloody stools.  ? ?Resp: No shortness of breath with exertion or at rest.  No excess mucus, no productive cough,  No non-productive cough,  No coughing up of blood.  No  change in color of mucus.  No wheezing.  No chest wall deformity ? ?Skin: no rash or lesions. ? ?GU: no dysuria, change in color of urine, no urgency or frequency.  No flank pain, no hematuria  ? ?MS:  + chronic joint pain  ? ? ? ?Physical Exam ? ?BP 124/80 (BP Location: Left Arm, Cuff Size: Large)   Pulse 80   Temp 98.3 ?F (36.8 ?C) (Temporal)   Ht 5\' 7"  (1.702 m)   Wt 288 lb 6.4 oz (130.8 kg)   SpO2 97%   BMI 45.17 kg/m?  ? ?GEN: A/Ox3; pleasant , NAD, well nourished  ?  ?HEENT:  [place]/AT,  EACs-clear, TMs-wnl, NOSE-clear, THROAT-clear, no lesions, no postnasal drip or exudate noted. Class 3-4 MP airway  ? ?NECK:  Supple w/ fair ROM; no JVD; normal carotid impulses w/o bruits; no thyromegaly or nodules palpated; no lymphadenopathy.   ? ?RESP  Clear  P & A; w/o, wheezes/ rales/ or rhonchi. no accessory muscle use, no dullness to percussion ? ?CARD:  RRR, no m/r/g, no peripheral edema, pulses intact, no cyanosis or clubbing. ? ?GI:   Soft & nt; nml bowel sounds; no organomegaly or masses detected.  ? ?Musco: Warm bil, no deformities or joint swelling noted.  ? ?Neuro: alert, no focal deficits noted.   ? ?Skin: Warm, no lesions or rashes ? ? ? ?Lab Results: ? ?CBC ? ? ?BNP ?No results found for: BNP ? ?ProBNP ?No results found for: PROBNP ? ?Imaging: ?No results found. ? ? ? ? ? ? ?Assessment & Plan:  ? ?OSA (obstructive sleep apnea) ?Reported history of sleep apnea.  We will try to find her previous sleep study.  Patient has ongoing daytime sleepiness, snoring, restless sleep.  This along with BMI of 45.  All suspicious that patient continues to have ongoing sleep apnea.  She has had a break in therapy and no longer has a CPAP.  We will set her up for home sleep study would like to have done a in lab split-night study since she had CPAP intolerance and is also on chronic narcotics.  However patient declines because she has a pet and has no one to watch them if she is away from home. ? ?- discussed how weight can  impact sleep and risk for sleep disordered breathing ?- discussed options to assist with weight loss: combination of diet modification, cardiovascular and strength training exercises ?  ?- had an extensive discussion regarding the adverse health consequences related to untreated sleep disordered breathing ?- specifically discussed the risks for hypertension, coronary artery disease, cardiac dysrhythmias, cerebrovascular disease, and diabetes ?- lifestyle modification discussed ?  ?-  discussed how sleep disruption can increase risk of accidents, particularly when driving ?- safe driving practices were discussed ?  ?Plan  ?Patient Instructions  ?Set up for home sleep study .  ?Work on healthy weight loss.  ?Activity as tolerated.  ?Do not drive if sleepy  ?Use caution with pain meds and sedating medications.  ?Follow up in 2 months to discuss results and treatment plan  ? ?  ? ? ?Daytime sleepiness ?Patient has ongoing daytime sleepiness with probable underlying sleep apnea.  She is also on chronic narcotics.  Patient education was given on use of daily pain medicines and sleepiness.  Also the dangers of using pain medicines with underlying untreated sleep apnea ? ?- discussed how weight can impact sleep and risk for sleep disordered breathing ?- discussed options to assist with weight loss: combination of diet modification, cardiovascular and strength training exercises ?  ?- had an extensive discussion regarding the adverse health consequences related to untreated sleep disordered breathing ?- specifically discussed the risks for hypertension, coronary artery disease, cardiac dysrhythmias, cerebrovascular disease, and diabetes ?- lifestyle modification discussed ?  ?- discussed how sleep disruption can increase risk of accidents, particularly when driving ?- safe driving practices were discussed ?  ?Plan ?Patient Instructions  ?Set up for home sleep study .  ?Work on healthy weight loss.  ?Activity as tolerated.   ?Do not drive if sleepy  ?Use caution with pain meds and sedating medications.  ?Follow up in 2 months to discuss results and treatment plan  ? ?  ? ? ?Morbid obesity with BMI of 45.0-49.9, adult ([place]) ?Healthy weight loss disc

## 2021-12-01 NOTE — Assessment & Plan Note (Signed)
Healthy weight loss discussed 

## 2021-12-01 NOTE — Patient Instructions (Signed)
Set up for home sleep study .  ?Work on healthy weight loss.  ?Activity as tolerated.  ?Do not drive if sleepy  ?Use caution with pain meds and sedating medications.  ?Follow up in 2 months to discuss results and treatment plan  ? ?

## 2021-12-01 NOTE — Assessment & Plan Note (Signed)
Reported history of sleep apnea.  We will try to find her previous sleep study.  Patient has ongoing daytime sleepiness, snoring, restless sleep.  This along with BMI of 45.  All suspicious that patient continues to have ongoing sleep apnea.  She has had a break in therapy and no longer has a CPAP.  We will set her up for home sleep study would like to have done a in lab split-night study since she had CPAP intolerance and is also on chronic narcotics.  However patient declines because she has a pet and has no one to watch them if she is away from home. ? ?- discussed how weight can impact sleep and risk for sleep disordered breathing ?- discussed options to assist with weight loss: combination of diet modification, cardiovascular and strength training exercises ?  ?- had an extensive discussion regarding the adverse health consequences related to untreated sleep disordered breathing ?- specifically discussed the risks for hypertension, coronary artery disease, cardiac dysrhythmias, cerebrovascular disease, and diabetes ?- lifestyle modification discussed ?  ?- discussed how sleep disruption can increase risk of accidents, particularly when driving ?- safe driving practices were discussed ?  ?Plan  ?Patient Instructions  ?Set up for home sleep study .  ?Work on healthy weight loss.  ?Activity as tolerated.  ?Do not drive if sleepy  ?Use caution with pain meds and sedating medications.  ?Follow up in 2 months to discuss results and treatment plan  ? ?  ? ?

## 2021-12-01 NOTE — Assessment & Plan Note (Signed)
Patient has ongoing daytime sleepiness with probable underlying sleep apnea.  She is also on chronic narcotics.  Patient education was given on use of daily pain medicines and sleepiness.  Also the dangers of using pain medicines with underlying untreated sleep apnea ? ?- discussed how weight can impact sleep and risk for sleep disordered breathing ?- discussed options to assist with weight loss: combination of diet modification, cardiovascular and strength training exercises ?  ?- had an extensive discussion regarding the adverse health consequences related to untreated sleep disordered breathing ?- specifically discussed the risks for hypertension, coronary artery disease, cardiac dysrhythmias, cerebrovascular disease, and diabetes ?- lifestyle modification discussed ?  ?- discussed how sleep disruption can increase risk of accidents, particularly when driving ?- safe driving practices were discussed ?  ?Plan ?Patient Instructions  ?Set up for home sleep study .  ?Work on healthy weight loss.  ?Activity as tolerated.  ?Do not drive if sleepy  ?Use caution with pain meds and sedating medications.  ?Follow up in 2 months to discuss results and treatment plan  ? ?  ? ?

## 2021-12-13 ENCOUNTER — Ambulatory Visit: Payer: Medicare Other

## 2021-12-13 DIAGNOSIS — R0683 Snoring: Secondary | ICD-10-CM

## 2021-12-16 ENCOUNTER — Ambulatory Visit: Payer: Medicare Other

## 2021-12-16 DIAGNOSIS — G4733 Obstructive sleep apnea (adult) (pediatric): Secondary | ICD-10-CM | POA: Diagnosis not present

## 2021-12-22 DIAGNOSIS — G4733 Obstructive sleep apnea (adult) (pediatric): Secondary | ICD-10-CM | POA: Diagnosis not present

## 2021-12-24 ENCOUNTER — Telehealth: Payer: Self-pay | Admitting: Adult Health

## 2021-12-24 NOTE — Telephone Encounter (Signed)
-----   Message from Courtney Paris sent at 12/22/2021  2:30 PM EDT ----- Hello this patient's HST report is ready for review

## 2021-12-24 NOTE — Telephone Encounter (Signed)
Home sleep study Dec 16, 2021 showed moderate obstructive sleep apnea with significant nocturnal desaturations SPO2 low at 55%.    Needs office visit to discuss results and treatment plan

## 2021-12-28 NOTE — Telephone Encounter (Signed)
Called and spoke with patient, provided results/recommendations per Rubye Oaks NP.  She has an OV on 6/12 on 10:30 am.  Nothing further needed.

## 2022-01-03 ENCOUNTER — Ambulatory Visit: Payer: Medicare Other | Admitting: Adult Health

## 2022-01-11 ENCOUNTER — Encounter: Payer: Self-pay | Admitting: Adult Health

## 2022-01-11 ENCOUNTER — Ambulatory Visit (INDEPENDENT_AMBULATORY_CARE_PROVIDER_SITE_OTHER): Payer: Medicare Other | Admitting: Adult Health

## 2022-01-11 DIAGNOSIS — G4733 Obstructive sleep apnea (adult) (pediatric): Secondary | ICD-10-CM

## 2022-01-11 DIAGNOSIS — Z6841 Body Mass Index (BMI) 40.0 and over, adult: Secondary | ICD-10-CM | POA: Diagnosis not present

## 2022-01-11 NOTE — Addendum Note (Signed)
Addended by: Delrae Rend on: 01/11/2022 03:44 PM   Modules accepted: Orders

## 2022-01-11 NOTE — Patient Instructions (Signed)
Set up for CPAP titration study.  Work on healthy weight loss.  Activity as tolerated.  Do not drive if sleepy  Use caution with pain meds and sedating medications.  Follow up in 2 months to discuss results and treatment plan

## 2022-01-11 NOTE — Progress Notes (Signed)
Reviewed and agree with assessment/plan.   Marcelus Dubberly, MD [facility] 01/11/2022, 4:59 PM Pager:  [phone]  

## 2022-01-11 NOTE — Assessment & Plan Note (Signed)
Healthy weight loss discussed 

## 2022-01-11 NOTE — Assessment & Plan Note (Signed)
Moderate obstructive sleep apnea with significant nocturnal desaturations.  Concern for possible hypoventilation.  Patient is on significant pain medications.  Recommend patient undergo a CPAP titration study.  May need evaluation for possible BiPAP if unable to control events on CPAP.  This will also help her to get more acclimated to CPAP and find the most comfortable pressure settings.  Plan  Patient Instructions  Set up for CPAP titration study.  Work on healthy weight loss.  Activity as tolerated.  Do not drive if sleepy  Use caution with pain meds and sedating medications.  Follow up in 2 months to discuss results and treatment plan

## 2022-01-11 NOTE — Progress Notes (Signed)
@Patient  ID: Wendy Parker, female    DOB: 1965/11/13, 56 y.o.   MRN: [id number]  Chief Complaint  Patient presents with   Follow-up    Referring provider: [id number], NP  HPI: 56 yo female seen 12/01/21 for sleep consult to establish for sleep apnea Medical history significant for chronic pain syndrome, hepatitis C and diabetes  TEST/EVENTS :  Home sleep study Dec 16, 2021 showed moderate obstructive sleep apnea with significant nocturnal desaturations SPO2 low at 55%.    01/11/2022 Follow up: OSA  Patient returns for a 6-week follow-up.  Patient was seen last visit for sleep consult to establish for sleep apnea.  Patient previously been diagnosed with sleep apnea past but only used her CPAP briefly because she says she could not tolerate it.  She turned her machine back in.  Patient's been having daily symptom burden with snoring, restless sleep daytime sleepiness and fatigue.   Has chronic pain on long term pain meds. Recently changed oxycodone to  Belbuca . Also has been tried on Morphine.  She was set up for home sleep study that showed moderate obstructive sleep apnea with significant nocturnal desaturations AHI 27/hour, SPO2 low at 55% and average O2 saturation at 88%.  We discussed her sleep study results went over treatment options including weight loss, oral appliance and CPAP.  Patient is resistant to restart CPAP she is very interested in inspire device.  However her current BMI is at 44.  We discussed a CPAP titration study as she may have a component of hypoventilation and need to have a titration study possible even BiPAP.  We went over this in detail.  Allergies  Allergen Reactions   Acetaminophen Itching and Other (See Comments)    swelling    Immunization History  Administered Date(s) Administered   Pneumococcal Polysaccharide-23 09/14/2016    Past Medical History:  Diagnosis Date   Arthritis    Asthma    Carpal tunnel syndrome    Depression    Hepatitis C      Tobacco History: Social History   Tobacco Use  Smoking Status Former   Packs/day: 1.00   Types: Cigarettes   Start date: 07/25/1973  Smokeless Tobacco Never   Counseling given: Not Answered   Outpatient Medications Prior to Visit  Medication Sig Dispense Refill   Accu-Chek Softclix Lancets lancets 3 (three) times daily.     albuterol (VENTOLIN HFA) 108 (90 Base) MCG/ACT inhaler As needed     atorvastatin (LIPITOR) 40 MG tablet Take by mouth. daily     LINZESS 145 MCG CAPS capsule Take 145 mcg by mouth daily.     Multiple Vitamin (MULTIVITAMIN ADULT PO) Multivitamin     omeprazole (PRILOSEC) 40 MG capsule Take 40 mg by mouth daily.     RYBELSUS 14 MG TABS Take 1 tablet by mouth daily.     BELBUCA 75 MCG FILM Take 1 strip by mouth 2 (two) times daily. (Patient not taking: Reported on 01/11/2022)     Oxycodone HCl 10 MG TABS Take 10 mg by mouth 2 (two) times daily as needed (PAIN).  (Patient not taking: Reported on 12/01/2021)     No facility-administered medications prior to visit.     Review of Systems:   Constitutional:   No  weight loss, night sweats,  Fevers, chills, fatigue, or  lassitude.  HEENT:   No headaches,  Difficulty swallowing,  Tooth/dental problems, or  Sore throat,  No sneezing, itching, ear ache, nasal congestion, post nasal drip,   CV:  No chest pain,  Orthopnea, PND, swelling in lower extremities, anasarca, dizziness, palpitations, syncope.   GI  No heartburn, indigestion, abdominal pain, nausea, vomiting, diarrhea, change in bowel habits, loss of appetite, bloody stools.   Resp: No shortness of breath with exertion or at rest.  No excess mucus, no productive cough,  No non-productive cough,  No coughing up of blood.  No change in color of mucus.  No wheezing.  No chest wall deformity  Skin: no rash or lesions.  GU: no dysuria, change in color of urine, no urgency or frequency.  No flank pain, no hematuria   MS:  No joint pain or  swelling.  No decreased range of motion.  No back pain.    Physical Exam  BP 132/76 (BP Location: Left Arm, Patient Position: Sitting, Cuff Size: Large)   Pulse 93   Temp 97.7 F (36.5 C) (Oral)   Ht 5\' 7"  (1.702 m)   Wt 284 lb (128.8 kg)   SpO2 98%   BMI 44.48 kg/m   GEN: A/Ox3; pleasant , NAD, well nourished    HEENT:  /AT,  NOSE-clear, THROAT-clear, no lesions, no postnasal drip or exudate noted.  Class III-IV MP airway  NECK:  Supple w/ fair ROM; no JVD; normal carotid impulses w/o bruits; no thyromegaly or nodules palpated; no lymphadenopathy.    RESP  Clear  P & A; w/o, wheezes/ rales/ or rhonchi. no accessory muscle use, no dullness to percussion  CARD:  RRR, no m/r/g, no peripheral edema, pulses intact, no cyanosis or clubbing.  GI:   Soft & nt; nml bowel sounds; no organomegaly or masses detected.   Musco: Warm bil, no deformities or joint swelling noted.   Neuro: alert, no focal deficits noted.    Skin: Warm, no lesions or rashes    Lab Results:    BNP No results found for: "BNP"  ProBNP No results found for: "PROBNP"  Imaging: No results found.        No data to display          No results found for: "NITRICOXIDE"      Assessment & Plan:   OSA (obstructive sleep apnea) Moderate obstructive sleep apnea with significant nocturnal desaturations.  Concern for possible hypoventilation.  Patient is on significant pain medications.  Recommend patient undergo a CPAP titration study.  May need evaluation for possible BiPAP if unable to control events on CPAP.  This will also help her to get more acclimated to CPAP and find the most comfortable pressure settings.  Plan  Patient Instructions  Set up for CPAP titration study.  Work on healthy weight loss.  Activity as tolerated.  Do not drive if sleepy  Use caution with pain meds and sedating medications.  Follow up in 2 months to discuss results and treatment plan      Morbid obesity  with BMI of 45.0-49.9, adult (HCC) Healthy weight loss discussed     12-18-1983, NP 01/11/2022

## 2022-01-13 ENCOUNTER — Telehealth: Payer: Self-pay | Admitting: Adult Health

## 2022-01-13 DIAGNOSIS — G4733 Obstructive sleep apnea (adult) (pediatric): Secondary | ICD-10-CM

## 2022-01-13 NOTE — Telephone Encounter (Signed)
home sleep study that showed moderate obstructive sleep apnea with significant nocturnal desaturations AHI 27/hour, SPO2 low at 55% and average O2 saturation at 88%  Patient was recommended for CPAP titration study.  Patient has called and is says that she cannot do the CPAP titration study.  She wants to start on therapy.  Discussion during office visit the CPAP titration would be more optimal however will begin CPAP 5 to 20 cm H2O. Mask of choice.  Enroll in [facility].  Will need an office visit 2 to 3 months after starting.  Once therapeutic on CPAP will need a overnight oximetry test on CPAP we can order this at her follow-up visit.

## 2022-01-14 NOTE — Telephone Encounter (Signed)
Disregard note from 01/14/22 at 2:04 pm, entered in error.

## 2022-01-14 NOTE — Telephone Encounter (Signed)
Called and spoke with [facility] from [facility].  He states the patient does not have any refills.  Patient was last seen 04/21/2021.  Script sent to pharmacy with 2 refills.  Patient will need an OV for further refills.

## 2022-01-31 ENCOUNTER — Ambulatory Visit: Payer: Medicare Other | Admitting: Adult Health

## 2022-02-07 ENCOUNTER — Ambulatory Visit: Payer: Medicare Other | Admitting: Physician Assistant

## 2022-03-03 ENCOUNTER — Telehealth: Payer: Self-pay | Admitting: Adult Health

## 2022-03-03 NOTE — Telephone Encounter (Signed)
ONO does show some desats on CPAP  <88% - 21 min , average 91% , ~4hr of test >90%. Will discuss in detail at ov . Declined CPAP titration .

## 2022-03-04 NOTE — Telephone Encounter (Signed)
Called and spoke with patient, provided results/recommendations per Rubye Oaks  NP. She verbalized understanding.  She has a f/u scheduled for 8/21.  Nothing further needed.

## 2022-03-07 ENCOUNTER — Encounter (HOSPITAL_BASED_OUTPATIENT_CLINIC_OR_DEPARTMENT_OTHER): Payer: Medicare Other | Admitting: Pulmonary Disease

## 2022-03-14 ENCOUNTER — Ambulatory Visit (INDEPENDENT_AMBULATORY_CARE_PROVIDER_SITE_OTHER): Payer: Medicare Other | Admitting: Adult Health

## 2022-03-14 ENCOUNTER — Encounter: Payer: Self-pay | Admitting: Adult Health

## 2022-03-14 DIAGNOSIS — G4733 Obstructive sleep apnea (adult) (pediatric): Secondary | ICD-10-CM | POA: Diagnosis not present

## 2022-03-14 DIAGNOSIS — Z6841 Body Mass Index (BMI) 40.0 and over, adult: Secondary | ICD-10-CM | POA: Diagnosis not present

## 2022-03-14 NOTE — Assessment & Plan Note (Signed)
Moderate OSA - improved compliance with CPAP , still has some ridges you will events.  We will adjust CPAP pressure to 10 to 20 cm H2O. Ideally would be better to have an in lab CPAP titration study.  However patient continues to decline to go for an in lab study. For now we will adjust CPAP pressure and check CPAP download in 1 month  Plan Patient Instructions  Increase CPAP pressure to 10 to 20 cm H2O. CPAP download in 1 month May try DreamWear nasal mask  Work on healthy weight loss.  Activity as tolerated.  Do not drive if sleepy  Use caution with pain meds and sedating medications.  Follow up in 4 months and As needed

## 2022-03-14 NOTE — Patient Instructions (Addendum)
Increase CPAP pressure to 10 to 20 cm H2O. CPAP download in 1 month May try DreamWear nasal mask  Work on healthy weight loss.  Activity as tolerated.  Do not drive if sleepy  Use caution with pain meds and sedating medications.  Follow up in 4 months and As needed

## 2022-03-14 NOTE — Progress Notes (Signed)
@Patient  ID: Wendy Parker, female    DOB: October 31, 1965, 56 y.o.   MRN: [id number]  Chief Complaint  Patient presents with   Follow-up    Referring provider: [id number], NP  HPI: 56 year old female seen for sleep consult Dec 01, 2021 to establish for sleep apnea Medical history significant for chronic pain syndrome, hep C and diabetes  TEST/EVENTS :  Home sleep study Dec 16, 2021 showed moderate obstructive sleep apnea with significant nocturnal desaturations SPO2 low at 55%.  AHI 27/hour  ONO does show some desats on CPAP  <88% - 21 min , average 91% , ~4hr of test >90%.  03/14/2022 Follow up : OSA  Patient returns for 78-month follow-up for sleep apnea.  Patient has underlying moderate obstructive sleep apnea with significant nocturnal hypoxemia.  She has been restarted on CPAP.  Patient's had difficulty tolerating CPAP in the past.  She was recommended for CPAP titration study due to the significant nocturnal hypoxemia during her sleep study in May.  However patient declines going for in lab study.  She has restarted CPAP.  Patient says she is try to get used to it.  She is doing better and trying to wear it every single night.  Patient says it still uncomfortable at times.  CPAP download shows 93% compliance.  With daily average usage at 6 hours.  Patient is on auto CPAP 5 to 15 cm H2O.  AHI 11.2/hour.  Daily average pressure at 15 cm H2O. Patient was set up for an overnight oximetry test that showed some desaturations on CPAP with oxygen less than 88% around 21 minutes during the overnight oximetry test.  Average O2 saturation was 91% on CPAP.  And 4 hours of the test was spent greater than 90% O2 saturation on CPAP. Says she does feel better and that she has less sleepiness and does not seem as restless. Feels she is benefiting from CPAP.    Allergies  Allergen Reactions   Acetaminophen Itching and Other (See Comments)    swelling    Immunization History  Administered Date(s)  Administered   Influenza-Unspecified 05/10/2021   Pneumococcal Polysaccharide-23 09/14/2016    Past Medical History:  Diagnosis Date   Arthritis    Asthma    Carpal tunnel syndrome    Depression    Hepatitis C     Tobacco History: Social History   Tobacco Use  Smoking Status Former   Packs/day: 1.00   Types: Cigarettes   Start date: 07/25/1973   Quit date: 07/25/2020   Years since quitting: 1.6  Smokeless Tobacco Never   Counseling given: Not Answered   Outpatient Medications Prior to Visit  Medication Sig Dispense Refill   Accu-Chek Softclix Lancets lancets 3 (three) times daily.     albuterol (VENTOLIN HFA) 108 (90 Base) MCG/ACT inhaler As needed     atorvastatin (LIPITOR) 40 MG tablet Take by mouth. daily     Multiple Vitamin (MULTIVITAMIN ADULT PO) Multivitamin     omeprazole (PRILOSEC) 40 MG capsule Take 40 mg by mouth daily.     RYBELSUS 14 MG TABS Take 1 tablet by mouth daily.     BELBUCA 75 MCG FILM Take 1 strip by mouth 2 (two) times daily. (Patient not taking: Reported on 01/11/2022)     LINZESS 145 MCG CAPS capsule Take 145 mcg by mouth daily. (Patient not taking: Reported on 03/14/2022)     Oxycodone HCl 10 MG TABS Take 10 mg by mouth 2 (two) times daily as  needed (PAIN).  (Patient not taking: Reported on 12/01/2021)     No facility-administered medications prior to visit.     Review of Systems:   Constitutional:   No  weight loss, night sweats,  Fevers, chills,  +fatigue, or  lassitude.  HEENT:   No headaches,  Difficulty swallowing,  Tooth/dental problems, or  Sore throat,                No sneezing, itching, ear ache, nasal congestion, post nasal drip,   CV:  No chest pain,  Orthopnea, PND, swelling in lower extremities, anasarca, dizziness, palpitations, syncope.   GI  No heartburn, indigestion, abdominal pain, nausea, vomiting, diarrhea, change in bowel habits, loss of appetite, bloody stools.   Resp: No shortness of breath with exertion or at rest.   No excess mucus, no productive cough,  No non-productive cough,  No coughing up of blood.  No change in color of mucus.  No wheezing.  No chest wall deformity  Skin: no rash or lesions.  GU: no dysuria, change in color of urine, no urgency or frequency.  No flank pain, no hematuria   MS:  No joint pain or swelling.  No decreased range of motion.  No back pain.    Physical Exam  BP 130/80 (BP Location: Left Arm, Cuff Size: Large)   Pulse 64   Temp 98.2 F (36.8 C) (Oral)   Ht 5\' 7"  (1.702 m)   Wt 287 lb (130.2 kg)   SpO2 99%   BMI 44.95 kg/m   GEN: A/Ox3; pleasant , NAD, well nourished    HEENT:  [place]/AT,  EACs-clear, TMs-wnl, NOSE-clear, THROAT-clear, no lesions, no postnasal drip or exudate noted.   NECK:  Supple w/ fair ROM; no JVD; normal carotid impulses w/o bruits; no thyromegaly or nodules palpated; no lymphadenopathy.    RESP  Clear  P & A; w/o, wheezes/ rales/ or rhonchi. no accessory muscle use, no dullness to percussion  CARD:  RRR, no m/r/g, no peripheral edema, pulses intact, no cyanosis or clubbing.  GI:   Soft & nt; nml bowel sounds; no organomegaly or masses detected.   Musco: Warm bil, no deformities or joint swelling noted.   Neuro: alert, no focal deficits noted.    Skin: Warm, no lesions or rashes    Lab Results:    BNP No results found for: "BNP"  ProBNP No results found for: "PROBNP"  Imaging: No results found.        No data to display          No results found for: "NITRICOXIDE"      Assessment & Plan:   OSA (obstructive sleep apnea) Moderate OSA - improved compliance with CPAP , still has some ridges you will events.  We will adjust CPAP pressure to 10 to 20 cm H2O. Ideally would be better to have an in lab CPAP titration study.  However patient continues to decline to go for an in lab study. For now we will adjust CPAP pressure and check CPAP download in 1 month  Plan Patient Instructions  Increase CPAP pressure to  10 to 20 cm H2O. CPAP download in 1 month May try DreamWear nasal mask  Work on healthy weight loss.  Activity as tolerated.  Do not drive if sleepy  Use caution with pain meds and sedating medications.  Follow up in 4 months and As needed        Morbid obesity with BMI of 45.0-49.9, adult (HCC)  Healthy weight loss discussed     Rubye Oaks, NP 03/14/2022

## 2022-03-14 NOTE — Assessment & Plan Note (Signed)
Healthy weight loss discussed 

## 2022-03-14 NOTE — Addendum Note (Signed)
Addended by: Arvilla Market on: 03/14/2022 12:48 PM   Modules accepted: Orders

## 2022-03-20 NOTE — Progress Notes (Signed)
Reviewed and agree with assessment/plan.   Daianna Vasques, MD [facility] 03/20/2022, 7:27 AM Pager:  [phone]  

## 2022-03-21 ENCOUNTER — Telehealth: Payer: Self-pay | Admitting: Adult Health

## 2022-03-22 NOTE — Telephone Encounter (Signed)
Patient bought a new mask from DME today, but is requesting the name of the mask she was shown in her visit on 03/14/2022. Patient states she thinks it is one that just goes across her nose.  Please advise.

## 2022-03-24 NOTE — Telephone Encounter (Signed)
Called and spoke to pt. Pt states she had to buy a new mask as the latex mask caused a skin reaction. Advised pt to call back if she has any additional trouble. Pt verbalized understanding and denied any further questions or concerns at this time.

## 2022-07-14 ENCOUNTER — Ambulatory Visit: Payer: Medicare Other | Admitting: Adult Health

## 2022-07-14 ENCOUNTER — Ambulatory Visit: Payer: Medicare Other | Admitting: Primary Care

## 2022-08-02 ENCOUNTER — Other Ambulatory Visit: Payer: Self-pay | Admitting: Nurse Practitioner

## 2022-08-02 DIAGNOSIS — Z1231 Encounter for screening mammogram for malignant neoplasm of breast: Secondary | ICD-10-CM

## 2022-08-05 ENCOUNTER — Encounter: Payer: Self-pay | Admitting: Primary Care

## 2022-08-05 ENCOUNTER — Ambulatory Visit (INDEPENDENT_AMBULATORY_CARE_PROVIDER_SITE_OTHER): Payer: Medicare Other | Admitting: Primary Care

## 2022-08-05 VITALS — BP 138/88 | HR 71 | Ht 67.0 in | Wt 284.6 lb

## 2022-08-05 DIAGNOSIS — R062 Wheezing: Secondary | ICD-10-CM | POA: Diagnosis not present

## 2022-08-05 DIAGNOSIS — G4733 Obstructive sleep apnea (adult) (pediatric): Secondary | ICD-10-CM

## 2022-08-05 MED ORDER — AIRSUPRA 90-80 MCG/ACT IN AERO
2.0000 | INHALATION_SPRAY | Freq: Four times a day (QID) | RESPIRATORY_TRACT | 1 refills | Status: DC | PRN
Start: 2022-08-05 — End: 2022-08-15

## 2022-08-05 NOTE — Assessment & Plan Note (Signed)
-  Patient reports hx mild intermittent asthma. Acutely exacerbated since mold exposure. No formal PFTs on file. She has been using Albuterol more frequently. Sending RX Airspura (albuterol-budesonide) 90-80MCG/ACT two puffs every 6 hours to use as needed for sob/wheezing. If symptom persist recommend getting pulmonary function testing at follow-up and chest xray.

## 2022-08-05 NOTE — Progress Notes (Signed)
@Patient  ID: Wendy Parker, female    DOB: Apr 23, 1966, 57 y.o.   MRN: [id number]  No chief complaint on file.   Referring provider: Armanda Heritage, NP  HPI: 57 year old female seen for sleep consult Dec 01, 2021 to establish for sleep apnea Medical history significant for chronic pain syndrome, hep C and diabetes  Previous [facility] pulmonary encounter:  03/14/2022 Follow up : OSA  Patient returns for 70-month follow-up for sleep apnea.  Patient has underlying moderate obstructive sleep apnea with significant nocturnal hypoxemia.  She has been restarted on CPAP.  Patient's had difficulty tolerating CPAP in the past.  She was recommended for CPAP titration study due to the significant nocturnal hypoxemia during her sleep study in May.  However patient declines going for in lab study.  She has restarted CPAP.  Patient says she is try to get used to it.  She is doing better and trying to wear it every single night.  Patient says it still uncomfortable at times.  CPAP download shows 93% compliance.  With daily average usage at 6 hours.  Patient is on auto CPAP 5 to 15 cm H2O.  AHI 11.2/hour.  Daily average pressure at 15 cm H2O. Patient was set up for an overnight oximetry test that showed some desaturations on CPAP with oxygen less than 88% around 21 minutes during the overnight oximetry test.  Average O2 saturation was 91% on CPAP.  And 4 hours of the test was spent greater than 90% O2 saturation on CPAP. Says she does feel better and that she has less sleepiness and does not seem as restless. Feels she is benefiting from CPAP.    08/05/2022 Patient presents today for 4 month follow-up. Overall she is doing well, tolerating CPAP without issues. Compliance continues to be fair. She moved apartments due to mold issues and was having trouble getting supplies sent to her. She feels rested when wearing consistently wearing PAP therapy. She uses full face mask  She has been wheezing more the last couple of  weeks since mold exposure. Using Albuterol on average 3 times a day.   Airview download 04/24/22/08/02/21 Usage 43/90 days; 37 days (41%) > 4 hours Average usage days used 5 hours 34 mins Pressure 10-20cm h20 (17.9cm h20-95%) Airleaks 23.7L/min (95%) AHI 2.1    TEST/EVENTS :  Home sleep study Dec 16, 2021 showed moderate obstructive sleep apnea with significant nocturnal desaturations SPO2 low at 55%.  AHI 27/hour  ONO does show some desats on CPAP  <88% - 21 min , average 91% , ~4hr of test >90%.  Allergies  Allergen Reactions   Acetaminophen Itching and Other (See Comments)    swelling    Immunization History  Administered Date(s) Administered   Influenza Inj Mdck Quad Pf 05/30/2022   Influenza-Unspecified 05/10/2021   Pneumococcal Polysaccharide-23 09/14/2016    Past Medical History:  Diagnosis Date   Arthritis    Asthma    Carpal tunnel syndrome    Depression    Hepatitis C     Tobacco History: Social History   Tobacco Use  Smoking Status Former   Packs/day: 1.00   Types: Cigarettes   Start date: 07/25/1973   Quit date: 07/25/2020   Years since quitting: 2.0  Smokeless Tobacco Never   Counseling given: Not Answered   Outpatient Medications Prior to Visit  Medication Sig Dispense Refill   Accu-Chek Softclix Lancets lancets 3 (three) times daily.     albuterol (VENTOLIN HFA) 108 (90 Base) MCG/ACT inhaler  As needed     atorvastatin (LIPITOR) 40 MG tablet Take by mouth. daily     Multiple Vitamin (MULTIVITAMIN ADULT PO) Multivitamin     omeprazole (PRILOSEC) 40 MG capsule Take 40 mg by mouth daily.     RYBELSUS 14 MG TABS Take 1 tablet by mouth daily.     No facility-administered medications prior to visit.    Review of Systems  Review of Systems  Constitutional: Negative.   HENT: Negative.    Respiratory:  Positive for wheezing.   Cardiovascular: Negative.     Physical Exam  BP 138/88 (BP Location: Left Arm, Cuff Size: Large)   Pulse 71   Ht 5'  7" (1.702 m)   Wt 284 lb 9.6 oz (129.1 kg)   SpO2 98%   BMI 44.57 kg/m  Physical Exam Constitutional:      Appearance: Normal appearance.  HENT:     Head: Normocephalic and atraumatic.     Mouth/Throat:     Mouth: Mucous membranes are moist.     Pharynx: Oropharynx is clear.  Cardiovascular:     Rate and Rhythm: Normal rate and regular rhythm.  Pulmonary:     Effort: Pulmonary effort is normal.     Breath sounds: No wheezing or rales.     Comments: No overt wheezing  Skin:    General: Skin is warm and dry.  Neurological:     General: No focal deficit present.     Mental Status: She is alert and oriented to person, place, and time. Mental status is at baseline.  Psychiatric:        Mood and Affect: Mood normal.        Behavior: Behavior normal.        Thought Content: Thought content normal.        Judgment: Judgment normal.      Lab Results:  CBC    Component Value Date/Time   WBC 4.7 07/23/2021 0855   RBC 5.38 (H) 07/23/2021 0855   HGB 13.8 07/23/2021 0855   HCT 43.7 07/23/2021 0855   PLT 237 07/23/2021 0855   MCV 81.2 07/23/2021 0855   MCH 25.7 (L) 07/23/2021 0855   MCHC 31.6 07/23/2021 0855   RDW 15.6 (H) 07/23/2021 0855    BMET    Component Value Date/Time   NA 136 07/23/2021 0855   K 4.1 07/23/2021 0855   CL 101 07/23/2021 0855   CO2 26 07/23/2021 0855   GLUCOSE 100 (H) 07/23/2021 0855   BUN 12 07/23/2021 0855   CREATININE 0.98 07/23/2021 0855   CREATININE 0.80 12/25/2017 1022   CALCIUM 9.4 07/23/2021 0855   GFRNONAA >60 07/23/2021 0855   GFRNONAA 84 12/25/2017 1022   GFRAA 98 12/25/2017 1022    BNP No results found for: "BNP"  ProBNP No results found for: "PROBNP"  Imaging: No results found.   Assessment & Plan:   OSA (obstructive sleep apnea) - Hx moderate OSA. Compliance with CPAP continues to be fair, she stopped wearing d.t recent move and had trouble getting supplies sent to her. She plans to resume use. Current pressure 10-20cm  h20; Residual AHI 2.1/hour. No pressure changes needs. Advise patient aim to wear CPAP every night for min 4-6 hours or longer every night. Encourage side sleeping position and weight loss efforts. FU in 3 months or sooner if needed.   Wheezing - Patient reports hx mild intermittent asthma. Acutely exacerbated since mold exposure. No formal PFTs on file. She has been using  Albuterol more frequently. Sending RX Airspura (albuterol-budesonide) 90-80MCG/ACT two puffs every 6 hours to use as needed for sob/wheezing. If symptom persist recommend getting pulmonary function testing at follow-up and chest xray.    Glenford Bayley, NP 08/05/2022

## 2022-08-05 NOTE — Assessment & Plan Note (Signed)
-  Hx moderate OSA. Compliance with CPAP continues to be fair, she stopped wearing d.t recent move and had trouble getting supplies sent to her. She plans to resume use. Current pressure 10-20cm h20; Residual AHI 2.1/hour. No pressure changes needs. Advise patient aim to wear CPAP every night for min 4-6 hours or longer every night. Encourage side sleeping position and weight loss efforts. FU in 3 months or sooner if needed.

## 2022-08-05 NOTE — Patient Instructions (Addendum)
   Recommendations: Stop Albuterol / Start Airsupra (albuterol-budesonide)- take 2 puffs every 6 hours as needed for shortness of breath/wheezing  Continue to wear CPAP every night for minimum 4 to 6 hours or longer  Follow-up: 3 months with Beth NP or sooner if needed

## 2022-08-10 ENCOUNTER — Telehealth: Payer: Self-pay

## 2022-08-10 NOTE — Telephone Encounter (Signed)
PA request received via CMM for Airsupra 90-80MCG/ACT aerosol  PA has been submitted to [place] and is pending determination.   Key: [id number]

## 2022-08-11 ENCOUNTER — Telehealth: Payer: Self-pay | Admitting: Primary Care

## 2022-08-11 NOTE — Telephone Encounter (Signed)
[employer] called to request a PA for patient.  Please call to discuss at [phone]

## 2022-08-12 NOTE — Telephone Encounter (Signed)
Patient is calling again regarding her PA for her inhaler.  Patient would like an update.

## 2022-08-15 ENCOUNTER — Other Ambulatory Visit: Payer: Self-pay | Admitting: Primary Care

## 2022-08-15 MED ORDER — PULMICORT FLEXHALER 90 MCG/ACT IN AEPB
2.0000 | INHALATION_SPRAY | Freq: Two times a day (BID) | RESPIRATORY_TRACT | 1 refills | Status: DC
Start: 2022-08-15 — End: 2022-08-29

## 2022-08-15 NOTE — Telephone Encounter (Signed)
Called and spoke with pt letting her know the info about the PA and she verbalized understanding. Nothing further needed.

## 2022-08-15 NOTE — Telephone Encounter (Signed)
PA has been DENIED due to:   Wendy Parker is denied because the information provided was not sufficient to support approval for medical necessity. The following required information was not provided and/or clarified: (1) If your diagnosis includes asthma; AND (2) the specific medical reasons why you are unable to use levalbuterol HFA.

## 2022-08-15 NOTE — Telephone Encounter (Signed)
Please let patient know Airsupra not covered. I will send in same medication called budesonide (pulmicort) that I want her to take twice a day. Use albuterol as needed for breakthrough symptoms q 4-6 hours.

## 2022-08-15 NOTE — Addendum Note (Signed)
Addended by: Martyn Ehrich on: 08/15/2022 01:14 PM   Modules accepted: Orders

## 2022-08-19 ENCOUNTER — Other Ambulatory Visit (HOSPITAL_COMMUNITY): Payer: Self-pay

## 2022-08-19 ENCOUNTER — Telehealth: Payer: Self-pay

## 2022-08-19 NOTE — Telephone Encounter (Signed)
PT also states she is SOB. Pls call to advise.

## 2022-08-19 NOTE — Telephone Encounter (Signed)
Patient Advocate Encounter   Received notification from [employer] Medicare Part D that prior authorization is required for Pulmicort Flexhaler 90MCG/ACT aerosol powder   Submitted: 08-19-2022 Key [id number]   Status is pending

## 2022-08-19 NOTE — Telephone Encounter (Signed)
PT calling again regarding the status of this PA. Pls call @ [phone]

## 2022-08-23 NOTE — Telephone Encounter (Signed)
PA has been DENIED due to:   Pulmicort Flexhaler is denied because it is not on your plan's Drug List (formulary). Medication authorization requires the following: (1) You need to try two (2) of these covered drugs: (a) Arnuity Ellipta, Flovent Diskus, or Flovent HFA. (b) Qvar RediHaler

## 2022-08-24 NOTE — Telephone Encounter (Signed)
Routing to [place] for review. Please see prior auth response and advise.

## 2022-08-25 ENCOUNTER — Telehealth: Payer: Self-pay | Admitting: Primary Care

## 2022-08-25 NOTE — Telephone Encounter (Signed)
Called and left message with [place] to call office back

## 2022-08-25 NOTE — Telephone Encounter (Signed)
[facility] calling about the appeal. States some information was missing from that request and they need that to move fwd.  [phone] is Call Back  Ref# [id number]  Diagnosis, Medication tried in the past incl dosage, If they ever stopped taking this drug please explain why.

## 2022-08-26 NOTE — Telephone Encounter (Signed)
See message from 01/26.

## 2022-08-29 ENCOUNTER — Other Ambulatory Visit: Payer: Self-pay | Admitting: *Deleted

## 2022-08-29 ENCOUNTER — Telehealth: Payer: Self-pay | Admitting: *Deleted

## 2022-08-29 MED ORDER — BUDESONIDE 90 MCG/ACT IN AEPB: 2.0000 | INHALATION_SPRAY | Freq: Two times a day (BID) | RESPIRATORY_TRACT | 1 refills | Status: DC

## 2022-08-29 NOTE — Telephone Encounter (Signed)
Spoke with [facility] Navigator regarding PA, Colin Ina is not covered, however, proair is covered.  She was calling just to f/u on the PA.  I advised her that I would follow up with the patient today.  She verbalized understanding.    According to the previous notes, the patient was made aware of the outcome of the PA.  I called and spoke with [place] and spoke with [facility].  She was following up on the inhaler, Airsupra that is not covered and verified that the proair is covered, but patient states that she does not get relief from the proair.  According to [place] note she sent in Pulmicort flexhaler.  [place] did not have a record of that being sent in.  Looking closer, it was [facility] [place].  I advised that I would cancel it for that pharmacy and send it to [facility].

## 2022-08-29 NOTE — Telephone Encounter (Signed)
Spoke with Harmon Pier with [place] regarding the pulmicort flex haler inhaler, she states that insurance does not cover this inhaler, but does cover Qvar Redihaler 40 and 80.  I advised I would send the provider a message and see what she wants to do regarding the inhaler.  She verbalized understanding.  Beth, the Pulmicort flex haler is not covered by insurance, but QVAR redihaler 40 and 80 are covered.  Do you want [place] to send in one of these for the patient?  Please advise.  Thank you.

## 2022-08-30 NOTE — Telephone Encounter (Signed)
Please send in Qvar 40mg  two puffs morning and evening; Continue Albuterol 2 puffs every 6 hours as needed

## 2022-08-31 ENCOUNTER — Other Ambulatory Visit: Payer: Self-pay

## 2022-08-31 MED ORDER — ALBUTEROL SULFATE HFA 108 (90 BASE) MCG/ACT IN AERS
2.0000 | INHALATION_SPRAY | Freq: Four times a day (QID) | RESPIRATORY_TRACT | 6 refills | Status: DC | PRN
Start: 2022-08-31 — End: 2023-06-05

## 2022-08-31 MED ORDER — QVAR REDIHALER 40 MCG/ACT IN AERB: 2.0000 | INHALATION_SPRAY | Freq: Two times a day (BID) | RESPIRATORY_TRACT | 6 refills | Status: DC

## 2022-08-31 NOTE — Telephone Encounter (Signed)
Refills or Qvar and Albuterol have been to patients pharmacy. Patient is aware. Nothing is further needed.

## 2022-09-22 ENCOUNTER — Ambulatory Visit
Admission: RE | Admit: 2022-09-22 | Discharge: 2022-09-22 | Disposition: A | Discharge status: Home / Self Care | Payer: 59 | Source: Ambulatory Visit | Attending: Nurse Practitioner | Admitting: Nurse Practitioner

## 2022-09-22 DIAGNOSIS — Z1231 Encounter for screening mammogram for malignant neoplasm of breast: Secondary | ICD-10-CM

## 2022-09-27 NOTE — Telephone Encounter (Signed)
Qvar was sent from my understanding. If Wendy Parker is not covered, send in Albuterol HFA 2 puffs every 4-6 hours prn sob/wheezing

## 2022-10-05 NOTE — Telephone Encounter (Signed)
Scripts for Qvar and Albuterol were sent to patient's pharmacy.  Nothing further needed. [place] and verified that the patient picked up her inhalers (Qvar and Albuterol).  Nothing further needed.

## 2022-10-11 ENCOUNTER — Encounter: Payer: Self-pay | Admitting: Nurse Practitioner

## 2022-10-11 ENCOUNTER — Ambulatory Visit (INDEPENDENT_AMBULATORY_CARE_PROVIDER_SITE_OTHER): Payer: 59 | Admitting: Nurse Practitioner

## 2022-10-11 VITALS — BP 128/78 | HR 78 | Temp 98.2°F | Ht 67.0 in | Wt 278.0 lb

## 2022-10-11 DIAGNOSIS — Z87891 Personal history of nicotine dependence: Secondary | ICD-10-CM

## 2022-10-11 DIAGNOSIS — K5900 Constipation, unspecified: Secondary | ICD-10-CM | POA: Diagnosis not present

## 2022-10-11 DIAGNOSIS — R7303 Prediabetes: Secondary | ICD-10-CM | POA: Diagnosis not present

## 2022-10-11 DIAGNOSIS — R768 Other specified abnormal immunological findings in serum: Secondary | ICD-10-CM

## 2022-10-11 NOTE — Progress Notes (Signed)
I,Sheena H Holbrook,acting as a Education administrator for Minette Brine, FNP.,have documented all relevant documentation on the behalf of Minette Brine, FNP,as directed by  Minette Brine, FNP while in the presence of Minette Brine, [place].   Subjective:     Patient ID: Wendy Parker , female    DOB: [birth year] , 57 y.o.   MRN: [id number]   Chief Complaint  Patient presents with   Establish Care    HPI  Patient presents today to establish care. Patient last saw previous PCP in November 2023. She had to have a FL2 form completed for special assistance for DSS. She is on disability - diabetes, severe arthritis, carpal tunnel, back pain and bilateral ankle surgeries. No children. Single.   Patient is concerned about her liver and digestion; patient does have HepC. Patient has not seen hepatology or GI in a long time. Patient does have chronic joint pain and carpal tunnel in her right hand. Patient does use a CPAP for sleeping ([employer]). She has not been wearing regularly. She did relocate which had mold present.  Patient reports history of total hysterectomy.   She also had positive RPR and the titer was 1:1. She was positive for Hepatitis C but no treatment her last HCV RNA was not detected.   Reports having a history of a bowel blockage a couple years ago. She has had a good amount of her care at [facility]. She has to take medications to have a bowel movement. When she eats she feels like her food is in her back. She has tried colace, miralax green tea.   She is no longer smoking, drinking or smoking marijuana.      Past Medical History:  Diagnosis Date   Arthritis    Asthma    Carpal tunnel syndrome    Depression    Hepatitis C      Family History  Problem Relation Age of Onset   Diabetes Mother    Diabetes Father    Cancer Father    Diabetes Sister      Current Outpatient Medications:    Accu-Chek Softclix Lancets lancets, 3 (three) times daily., Disp: , Rfl:    albuterol (VENTOLIN HFA)  108 (90 Base) MCG/ACT inhaler, Inhale 2 puffs into the lungs every 6 (six) hours as needed for wheezing or shortness of breath. As needed, Disp: 1 each, Rfl: 6   APPLE CIDER VINEGAR PO, Take by mouth., Disp: , Rfl:    atorvastatin (LIPITOR) 40 MG tablet, Take by mouth. daily, Disp: , Rfl:    BLACK CURRANT SEED OIL PO, Take by mouth., Disp: , Rfl:    Budesonide 90 MCG/ACT inhaler, Inhale 2 puffs into the lungs 2 (two) times daily., Disp: 1 each, Rfl: 1   Multiple Vitamin (MULTIVITAMIN ADULT PO), Multivitamin, Disp: , Rfl:    omeprazole (PRILOSEC) 40 MG capsule, Take 40 mg by mouth daily., Disp: , Rfl:    RYBELSUS 14 MG TABS, Take 1 tablet by mouth daily., Disp: , Rfl:    TURMERIC PO, Take by mouth., Disp: , Rfl:    beclomethasone (QVAR REDIHALER) 40 MCG/ACT inhaler, Inhale 2 puffs into the lungs in the morning and at bedtime. (Patient not taking: Reported on 10/11/2022), Disp: 1 each, Rfl: 6   Allergies  Allergen Reactions   Acetaminophen Itching and Other (See Comments)    swelling     Review of Systems  Constitutional: Negative.  Negative for activity change and fatigue.  Eyes:  Negative for visual disturbance.  Respiratory: Negative.  Negative for choking, shortness of breath and wheezing.   Cardiovascular: Negative.  Negative for chest pain, palpitations and leg swelling.  Gastrointestinal: Negative.   Endocrine: Negative.  Negative for polydipsia, polyphagia and polyuria.  Musculoskeletal:  Positive for arthralgias.  Skin: Negative.   Neurological:  Positive for numbness. Negative for dizziness, weakness and headaches.  Psychiatric/Behavioral:  Negative for confusion. The patient is not nervous/anxious.      Today's Vitals   10/11/22 1116 10/11/22 1220  BP: (!) 128/92 128/78  Pulse: 78   Temp: 98.2 F (36.8 C)   TempSrc: Oral   SpO2: 96%   Weight: 278 lb (126.1 kg)   Height: 5\' 7"  (1.702 m)    Body mass index is 43.54 kg/m.   Objective:  Physical Exam Vitals reviewed.   Constitutional:      General: She is not in acute distress.    Appearance: Normal appearance. She is well-developed. She is obese.  HENT:     Head: Normocephalic and atraumatic.  Eyes:     Pupils: Pupils are equal, round, and reactive to light.  Cardiovascular:     Rate and Rhythm: Normal rate and regular rhythm.     Pulses: Normal pulses.     Heart sounds: Normal heart sounds. No murmur heard. Pulmonary:     Effort: Pulmonary effort is normal. No respiratory distress.     Breath sounds: Normal breath sounds. No wheezing.  Musculoskeletal:        General: Normal range of motion.  Skin:    General: Skin is warm and dry.     Capillary Refill: Capillary refill takes less than 2 seconds.  Neurological:     General: No focal deficit present.     Mental Status: She is alert and oriented to person, place, and time.     Cranial Nerves: No cranial nerve deficit.     Motor: No weakness.  Psychiatric:        Mood and Affect: Mood normal.         Assessment And Plan:     1. Constipation, unspecified constipation type Comments: Increase her fiber intake.  Encouraged to take a probiotic daily.  Make sure to drink at least 64 ounces of water a day.  Check a KUB for constipation. - DG Abd 1 View; Future  2. Prediabetes Comments: Previous history of prediabetes.  He has been taking Rybelsus. - Hemoglobin A1c  3. Hepatitis C antibody test positive Comments: She was tested positive in the past for hepatitis C.  Will check a HCVRNA to make sure that she is not  still positive. - CMP14+EGFR - HCV RNA quant  4. History of tobacco abuse Comments: She has a 40-pack-year history of smoking and quit smoking in 2022 will check a low-dose CT scan. - CT CHEST LUNG CA SCREEN LOW DOSE W/O CM; Future   Patient was given opportunity to ask questions. Patient verbalized understanding of the plan and was able to repeat key elements of the plan. All questions were answered to their satisfaction.    Minette Brine, FNP    I, Minette Brine, FNP, have reviewed all documentation for this visit. The documentation on 10/11/22 for the exam, diagnosis, procedures, and orders are all accurate and complete.  THE PATIENT IS ENCOURAGED TO PRACTICE SOCIAL DISTANCING DUE TO THE COVID-19 PANDEMIC.

## 2022-10-11 NOTE — Patient Instructions (Signed)
Go to [facility] for xrays on [address].

## 2022-10-12 LAB — CMP14+EGFR
ALT: 28 IU/L (ref 0–32)
AST: 23 IU/L (ref 0–40)
Albumin/Globulin Ratio: 1.6 (ref 1.2–2.2)
Albumin: 4.3 g/dL (ref 3.8–4.9)
Alkaline Phosphatase: 102 IU/L (ref 44–121)
BUN/Creatinine Ratio: 19 (ref 9–23)
BUN: 13 mg/dL (ref 6–24)
Bilirubin Total: 0.2 mg/dL (ref 0.0–1.2)
CO2: 23 mmol/L (ref 20–29)
Calcium: 9.5 mg/dL (ref 8.7–10.2)
Chloride: 106 mmol/L (ref 96–106)
Creatinine, Ser: 0.67 mg/dL (ref 0.57–1.00)
Globulin, Total: 2.7 g/dL (ref 1.5–4.5)
Glucose: 96 mg/dL (ref 70–99)
Potassium: 4.3 mmol/L (ref 3.5–5.2)
Sodium: 143 mmol/L (ref 134–144)
Total Protein: 7 g/dL (ref 6.0–8.5)
eGFR: 101 mL/min/{1.73_m2} (ref >59)

## 2022-10-12 LAB — HCV RNA QUANT: Hepatitis C Quantitation: NOT DETECTED IU/mL

## 2022-10-12 LAB — HEMOGLOBIN A1C
Est. average glucose Bld gHb Est-mCnc: 123 mg/dL
Hgb A1c MFr Bld: 5.9 % — ABNORMAL HIGH (ref 4.8–5.6)

## 2022-10-17 ENCOUNTER — Telehealth: Payer: Self-pay | Admitting: Primary Care

## 2022-10-17 NOTE — Telephone Encounter (Signed)
I called PT to resched appt w/[name]. She said she has not been using the CPAP machine due to a face mask issue so what is the use of a FU appt. She has lost a lot of wt too, she said.  I told her we want to be sure she is successful with the CPAP and we would do all we could to help her. I told her a Nurse would be calling her back so we can find a resolution.

## 2022-10-18 NOTE — Telephone Encounter (Signed)
I spoke with the pt and notified of response from [facility]  She verbalized understanding  She states will keep appt 11/07/22  Nothing further needed

## 2022-10-18 NOTE — Telephone Encounter (Signed)
Spoke with the pt  She states that she is not using CPAP and it's been about 4 months since she tried  She was never able to get her mask to stay on throughout the night-using foam mask  She says she previously tried plastic mask and it stayed on better but caused her skin to break out  She states that she is wondering if still needs CPAP at all  She has lost 14 lbs since her last visit here 08/05/22   Eustaquio Maize, please advise, thanks!  I did not print DL since she has not been using machine for several months

## 2022-10-18 NOTE — Telephone Encounter (Signed)
She needs visit, looks like she has one set for 4/15. We can address alternative treatment options at that time and possibly repeating sleep study due to weight loss. Thanks Magda Paganini

## 2022-11-04 ENCOUNTER — Telehealth: Payer: Self-pay | Admitting: Nurse Practitioner

## 2022-11-04 ENCOUNTER — Ambulatory Visit: Payer: Medicare Other | Admitting: Primary Care

## 2022-11-04 NOTE — Telephone Encounter (Signed)
Contacted Wendy Parker to schedule their annual wellness visit. Appointment made for 11/09/22.  [name] AWV direct phone # [phone])*

## 2022-11-07 ENCOUNTER — Encounter: Payer: Self-pay | Admitting: Primary Care

## 2022-11-07 ENCOUNTER — Ambulatory Visit (INDEPENDENT_AMBULATORY_CARE_PROVIDER_SITE_OTHER): Payer: 59 | Admitting: Primary Care

## 2022-11-07 VITALS — BP 122/76 | HR 68 | Ht 67.0 in | Wt 282.2 lb

## 2022-11-07 DIAGNOSIS — G4733 Obstructive sleep apnea (adult) (pediatric): Secondary | ICD-10-CM

## 2022-11-07 DIAGNOSIS — J4530 Mild persistent asthma, uncomplicated: Secondary | ICD-10-CM

## 2022-11-07 NOTE — Patient Instructions (Addendum)
Recommendations I would like you to try a new CPAP facemask to see if this is more tolerable Aim to wear CPAP nightly for 4 to 6 hours or longer Work on weight loss and focus on side sleep position  We can repeat sleep study once you are able to get close to your goal weight of 200lbs  If unable to tolerate CPAP, consider oral appliance   Orders: DME order for [facility] hybrid full face mask   Follow-up: 6 months with Waynetta Sandy NP

## 2022-11-07 NOTE — Assessment & Plan Note (Signed)
-   Improved with weight loss and using Budesonide inhaler

## 2022-11-07 NOTE — Assessment & Plan Note (Addendum)
-   HST May 2023 >> moderate OSA with severe oxygen desaturations/ AHI 27.1 an hour with SpO2 low 55% (average 88%). She continues to struggle with CPAP compliance/tolerance and has not worn her CPAP in 4 months. She reports sleeping better and is non longer waking up gasping for air. She has lost 8-10lbs in the last year, however, this is likely not significant enough to impact her overall apnea. I would hold off on repeating sleep study until she is able to lose 10-15% of her current weight. She needs to continue to wear CPAP while working on weight loss. She is not an inspire candidate at this time due to BMI being >40. Oral appliance likely not affordable but could consider. I would like patient to try new CPAP facemask to see if this is more tolerable. Advised she aim to wear CPAP nightly for 4 to 6 hours or longer. Continue side sleeping position.

## 2022-11-07 NOTE — Progress Notes (Addendum)
  ID: Wendy Parker, female    DOB: 1965/12/18, 57 y.o.   MRN: [id number]  Chief Complaint  Patient presents with   Follow-up    Referring provider: Filomena Jungling, NP  HPI: 57 year old female seen for sleep consult Dec 01, 2021 to establish for sleep apnea Medical history significant for chronic pain syndrome, hep C and diabetes  Previous [facility] pulmonary encounter:  03/14/2022 Follow up : OSA  Patient returns for 4-month follow-up for sleep apnea.  Patient has underlying moderate obstructive sleep apnea with significant nocturnal hypoxemia.  She has been restarted on CPAP.  Patient's had difficulty tolerating CPAP in the past.  She was recommended for CPAP titration study due to the significant nocturnal hypoxemia during her sleep study in May.  However patient declines going for in lab study.  She has restarted CPAP.  Patient says she is try to get used to it.  She is doing better and trying to wear it every single night.  Patient says it still uncomfortable at times.  CPAP download shows 93% compliance.  With daily average usage at 6 hours.  Patient is on auto CPAP 5 to 15 cm H2O.  AHI 11.2/hour.  Daily average pressure at 15 cm H2O. Patient was set up for an overnight oximetry test that showed some desaturations on CPAP with oxygen less than 88% around 21 minutes during the overnight oximetry test.  Average O2 saturation was 91% on CPAP.  And 4 hours of the test was spent greater than 90% O2 saturation on CPAP. Says she does feel better and that she has less sleepiness and does not seem as restless. Feels she is benefiting from CPAP.   08/05/2022 Patient presents today for 4 month follow-up. Overall she is doing well, tolerating CPAP without issues. Compliance continues to be fair. She moved apartments due to mold issues and was having trouble getting supplies sent to her. She feels rested when wearing consistently wearing PAP therapy. She uses full face mask  She has been wheezing  more the last couple of weeks since mold exposure. Using Albuterol on average 3 times a day.   Airview download 04/24/22/08/02/21 Usage 43/90 days; 37 days (41%) > 4 hours Average usage days used 5 hours 34 mins Pressure 10-20cm h20 (17.9cm h20-95%) Airleaks 23.7L/min (95%) AHI 2.1   11/07/2022- Interim hx Patient presents today for follow-up OSA. Last sleep study was in May 2023 which showed moderate obstructive sleep apnea, average AHI 27.1 an hour with SpO2 low 55% (88%). Patient was set up for an overnight oximetry test that showed some desaturations on CPAP with oxygen less than 88% around 21 minutes during the overnight oximetry test.  Average O2 saturation was 91% on CPAP.  And 4 hours of the test was spent greater than 90% O2 saturation on CPAP.  Compliance has been an ongoing issue for her. During her last overview in January her CPAP compliance was fair at 41% usage in 90 days >4 hours. She has not worn CPAP in the last 4 months.  She has lost 14 pounds.  She is interested in alternative treatment options for sleep apnea and or repeating sleep study due to weight loss.   She feels she is sleeping better. She has no issues waking up gasping for air. She is a side sleeper. She has lost 8-10lbs. She weighed 288lbs during her last sleep study. Her weight at home is between 274-277lbs. She is still working on weight loss, goal is <200lbs. She has  difficulty tolerating CPAP mask. She tells me, "if its going to bother her sleep, she is not going to wear it".   Breathing has also improved with weight loss and budesonide inhaler daily. No wheezing.  Airview download 07/28/22-10/25/22 Usage 6/90 days; 5 days (6%) > 4 hours  Average usage days used 4 hours 57 mins  Pressure 10-20cm h20 Airleaks 36.6L/min  AHI 2.8    TEST/EVENTS :  Home sleep study Dec 16, 2021 showed moderate obstructive sleep apnea with significant nocturnal desaturations SPO2 low at 55%.  AHI 27/hour  ONO does show some desats  on CPAP  <88% - 21 min , average 91% , ~4hr of test >90%.   Allergies  Allergen Reactions   Acetaminophen Itching and Other (See Comments)    swelling    Immunization History  Administered Date(s) Administered   Influenza Inj Mdck Quad Pf 05/10/2021, 05/30/2022   Influenza-Unspecified 05/10/2021   Janssen (J&J) SARS-COV-2 Vaccination 11/04/2019   Pneumococcal Polysaccharide-23 09/14/2016    Past Medical History:  Diagnosis Date   Arthritis    Asthma    Carpal tunnel syndrome    Depression    Hepatitis C     Tobacco History: Social History   Tobacco Use  Smoking Status Former   Packs/day: 1.00   Years: 40.00   Additional pack years: 0.00   Total pack years: 40.00   Types: Cigarettes   Start date: 07/25/1973   Quit date: 07/25/2020   Years since quitting: 2.2  Smokeless Tobacco Never   Counseling given: Not Answered   Outpatient Medications Prior to Visit  Medication Sig Dispense Refill   Accu-Chek Softclix Lancets lancets 3 (three) times daily.     albuterol (VENTOLIN HFA) 108 (90 Base) MCG/ACT inhaler Inhale 2 puffs into the lungs every 6 (six) hours as needed for wheezing or shortness of breath. As needed 1 each 6   APPLE CIDER VINEGAR PO Take by mouth.     atorvastatin (LIPITOR) 40 MG tablet Take by mouth. daily     BLACK CURRANT SEED OIL PO Take by mouth.     Budesonide 90 MCG/ACT inhaler Inhale 2 puffs into the lungs 2 (two) times daily. 1 each 1   Multiple Vitamin (MULTIVITAMIN ADULT PO) Multivitamin     omeprazole (PRILOSEC) 40 MG capsule Take 40 mg by mouth daily.     RYBELSUS 14 MG TABS Take 1 tablet by mouth daily.     TURMERIC PO Take by mouth.     beclomethasone (QVAR REDIHALER) 40 MCG/ACT inhaler Inhale 2 puffs into the lungs in the morning and at bedtime. 1 each 6   No facility-administered medications prior to visit.   Review of Systems  Review of Systems  Constitutional: Negative.  Negative for fatigue.  HENT: Negative.    Respiratory:  Negative.  Negative for shortness of breath and wheezing.   Cardiovascular: Negative.     Physical Exam  BP 122/76 (BP Location: Left Arm, Patient Position: Sitting, Cuff Size: Large)   Pulse 68   Ht  (1.702 m)   Wt 282 lb 3.2 oz (128 kg)   SpO2 98%   BMI 44.20 kg/m  Physical Exam Constitutional:      General: She is not in acute distress.    Appearance: Normal appearance. She is obese. She is not ill-appearing.  HENT:     Mouth/Throat:     Mouth: Mucous membranes are moist.     Pharynx: Oropharynx is clear.  Cardiovascular:  Rate and Rhythm: Normal rate and regular rhythm.  Pulmonary:     Effort: Pulmonary effort is normal.     Breath sounds: Normal breath sounds. No wheezing.  Skin:    General: Skin is warm and dry.  Neurological:     General: No focal deficit present.     Mental Status: She is alert and oriented to person, place, and time. Mental status is at baseline.  Psychiatric:        Mood and Affect: Mood normal.        Behavior: Behavior normal.        Thought Content: Thought content normal.        Judgment: Judgment normal.      Lab Results:  CBC    Component Value Date/Time   WBC 4.7 07/23/2021 0855   RBC 5.38 (H) 07/23/2021 0855   HGB 13.8 07/23/2021 0855   HCT 43.7 07/23/2021 0855   PLT 237 07/23/2021 0855   MCV 81.2 07/23/2021 0855   MCH 25.7 (L) 07/23/2021 0855   MCHC 31.6 07/23/2021 0855   RDW 15.6 (H) 07/23/2021 0855    BMET    Component Value Date/Time   NA 143 10/11/2022 1226   K 4.3 10/11/2022 1226   CL 106 10/11/2022 1226   CO2 23 10/11/2022 1226   GLUCOSE 96 10/11/2022 1226   GLUCOSE 100 (H) 07/23/2021 0855   BUN 13 10/11/2022 1226   CREATININE 0.67 10/11/2022 1226   CREATININE 0.80 12/25/2017 1022   CALCIUM 9.5 10/11/2022 1226   GFRNONAA >60 07/23/2021 0855   GFRNONAA 84 12/25/2017 1022   GFRAA 98 12/25/2017 1022    BNP No results found for: "BNP"  ProBNP No results found for: "PROBNP"  Imaging: No  results found.   Assessment & Plan:   OSA (obstructive sleep apnea) - HST May 2023 >> moderate OSA with severe oxygen desaturations/ AHI 27.1 an hour with SpO2 low 55% (average 88%). She continues to struggle with CPAP compliance/tolerance and has not worn her CPAP in 4 months. She reports sleeping better and is non longer waking up gasping for air. She has lost 8-10lbs in the last year, however, this is likely not significant enough to impact her overall apnea. I would hold off on repeating sleep study until she is able to lose 10-15% of her current weight. She needs to continue to wear CPAP while working on weight loss. She is not an inspire candidate at this time due to BMI being >40. Oral appliance likely not affordable but could consider. I would like patient to try new CPAP facemask to see if this is more tolerable. Advised she aim to wear CPAP nightly for 4 to 6 hours or longer. Continue side sleeping position.   Mild persistent asthma without complication - Improved with weight loss and using Budesonide inhaler    Glenford Bayley, NP 11/07/2022

## 2022-11-07 NOTE — Progress Notes (Signed)
Reviewed and agree with assessment/plan.   Coralyn Helling, MD [facility] Pulmonary/Critical Care 11/07/2022, 12:53 PM Pager:  [phone]

## 2022-11-09 ENCOUNTER — Ambulatory Visit (INDEPENDENT_AMBULATORY_CARE_PROVIDER_SITE_OTHER): Payer: 59

## 2022-11-09 VITALS — Ht 67.0 in | Wt 277.0 lb

## 2022-11-09 DIAGNOSIS — Z0000 Encounter for general adult medical examination without abnormal findings: Secondary | ICD-10-CM

## 2022-11-09 NOTE — Patient Instructions (Signed)
[name] , Thank you for taking time to come for your Medicare Wellness Visit. I appreciate your ongoing commitment to your health goals. Please review the following plan we discussed and let me know if I can assist you in the future.   These are the goals we discussed:  Goals      Patient Stated     11/09/2022, wants to weight 240-250 pounds        This is a list of the screening recommended for you and due dates:  Health Maintenance  Topic Date Due   DTaP/Tdap/Td vaccine (1 - Tdap) Never done   Pap Smear  Never done   Screening for Lung Cancer  Never done   Zoster (Shingles) Vaccine (1 of 2) Never done   COVID-19 Vaccine (2 - 2023-24 season) 03/25/2022   Flu Shot  02/23/2023   Medicare Annual Wellness Visit  11/09/2023   Mammogram  09/21/2024   Colon Cancer Screening  09/15/2031   Hepatitis C Screening: USPSTF Recommendation to screen - Ages 18-79 yo.  Completed   HIV Screening  Completed   HPV Vaccine  Aged Out    Advanced directives: Advance directive discussed with you today.   Conditions/risks identified: none  Next appointment: Follow up in one year for your annual wellness visit.   Preventive Care 40-64 Years, Female Preventive care refers to lifestyle choices and visits with your health care provider that can promote health and wellness. What does preventive care include? A yearly physical exam. This is also called an annual well check. Dental exams once or twice a year. Routine eye exams. Ask your health care provider how often you should have your eyes checked. Personal lifestyle choices, including: Daily care of your teeth and gums. Regular physical activity. Eating a healthy diet. Avoiding tobacco and drug use. Limiting alcohol use. Practicing safe sex. Taking low-dose aspirin daily starting at age 57. Taking vitamin and mineral supplements as recommended by your health care provider. What happens during an annual well check? The services and screenings  done by your health care provider during your annual well check will depend on your age, overall health, lifestyle risk factors, and family history of disease. Counseling  Your health care provider may ask you questions about your: Alcohol use. Tobacco use. Drug use. Emotional well-being. Home and relationship well-being. Sexual activity. Eating habits. Work and work Astronomer. Method of birth control. Menstrual cycle. Pregnancy history. Screening  You may have the following tests or measurements: Height, weight, and BMI. Blood pressure. Lipid and cholesterol levels. These may be checked every 5 years, or more frequently if you are over 64 years old. Skin check. Lung cancer screening. You may have this screening every year starting at age 75 if you have a 30-pack-year history of smoking and currently smoke or have quit within the past 15 years. Fecal occult blood test (FOBT) of the stool. You may have this test every year starting at age 56. Flexible sigmoidoscopy or colonoscopy. You may have a sigmoidoscopy every 5 years or a colonoscopy every 10 years starting at age 44. Hepatitis C blood test. Hepatitis B blood test. Sexually transmitted disease (STD) testing. Diabetes screening. This is done by checking your blood sugar (glucose) after you have not eaten for a while (fasting). You may have this done every 1-3 years. Mammogram. This may be done every 1-2 years. Talk to your health care provider about when you should start having regular mammograms. This may depend on whether you have  a family history of breast cancer. BRCA-related cancer screening. This may be done if you have a family history of breast, ovarian, tubal, or peritoneal cancers. Pelvic exam and Pap test. This may be done every 3 years starting at age 13. Starting at age 88, this may be done every 5 years if you have a Pap test in combination with an HPV test. Bone density scan. This is done to screen for osteoporosis.  You may have this scan if you are at high risk for osteoporosis. Discuss your test results, treatment options, and if necessary, the need for more tests with your health care provider. Vaccines  Your health care provider may recommend certain vaccines, such as: Influenza vaccine. This is recommended every year. Tetanus, diphtheria, and acellular pertussis (Tdap, Td) vaccine. You may need a Td booster every 10 years. Zoster vaccine. You may need this after age 81. Pneumococcal 13-valent conjugate (PCV13) vaccine. You may need this if you have certain conditions and were not previously vaccinated. Pneumococcal polysaccharide (PPSV23) vaccine. You may need one or two doses if you smoke cigarettes or if you have certain conditions. Talk to your health care provider about which screenings and vaccines you need and how often you need them. This information is not intended to replace advice given to you by your health care provider. Make sure you discuss any questions you have with your health care provider. Document Released: 08/07/2015 Document Revised: 03/30/2016 Document Reviewed: 05/12/2015 Elsevier Interactive Patient Education  2017 [employer].    Fall Prevention in the Home Falls can cause injuries. They can happen to people of all ages. There are many things you can do to make your home safe and to help prevent falls. What can I do on the outside of my home? Regularly fix the edges of walkways and driveways and fix any cracks. Remove anything that might make you trip as you walk through a door, such as a raised step or threshold. Trim any bushes or trees on the path to your home. Use bright outdoor lighting. Clear any walking paths of anything that might make someone trip, such as rocks or tools. Regularly check to see if handrails are loose or broken. Make sure that both sides of any steps have handrails. Any raised decks and porches should have guardrails on the edges. Have any  leaves, snow, or ice cleared regularly. Use sand or salt on walking paths during winter. Clean up any spills in your garage right away. This includes oil or grease spills. What can I do in the bathroom? Use night lights. Install grab bars by the toilet and in the tub and shower. Do not use towel bars as grab bars. Use non-skid mats or decals in the tub or shower. If you need to sit down in the shower, use a plastic, non-slip stool. Keep the floor dry. Clean up any water that spills on the floor as soon as it happens. Remove soap buildup in the tub or shower regularly. Attach bath mats securely with double-sided non-slip rug tape. Do not have throw rugs and other things on the floor that can make you trip. What can I do in the bedroom? Use night lights. Make sure that you have a light by your bed that is easy to reach. Do not use any sheets or blankets that are too big for your bed. They should not hang down onto the floor. Have a firm chair that has side arms. You can use this for support while  you get dressed. Do not have throw rugs and other things on the floor that can make you trip. What can I do in the kitchen? Clean up any spills right away. Avoid walking on wet floors. Keep items that you use a lot in easy-to-reach places. If you need to reach something above you, use a strong step stool that has a grab bar. Keep electrical cords out of the way. Do not use floor polish or wax that makes floors slippery. If you must use wax, use non-skid floor wax. Do not have throw rugs and other things on the floor that can make you trip. What can I do with my stairs? Do not leave any items on the stairs. Make sure that there are handrails on both sides of the stairs and use them. Fix handrails that are broken or loose. Make sure that handrails are as long as the stairways. Check any carpeting to make sure that it is firmly attached to the stairs. Fix any carpet that is loose or worn. Avoid  having throw rugs at the top or bottom of the stairs. If you do have throw rugs, attach them to the floor with carpet tape. Make sure that you have a light switch at the top of the stairs and the bottom of the stairs. If you do not have them, ask someone to add them for you. What else can I do to help prevent falls? Wear shoes that: Do not have high heels. Have rubber bottoms. Are comfortable and fit you well. Are closed at the toe. Do not wear sandals. If you use a stepladder: Make sure that it is fully opened. Do not climb a closed stepladder. Make sure that both sides of the stepladder are locked into place. Ask someone to hold it for you, if possible. Clearly mark and make sure that you can see: Any grab bars or handrails. First and last steps. Where the edge of each step is. Use tools that help you move around (mobility aids) if they are needed. These include: Canes. Walkers. Scooters. Crutches. Turn on the lights when you go into a dark area. Replace any light bulbs as soon as they burn out. Set up your furniture so you have a clear path. Avoid moving your furniture around. If any of your floors are uneven, fix them. If there are any pets around you, be aware of where they are. Review your medicines with your doctor. Some medicines can make you feel dizzy. This can increase your chance of falling. Ask your doctor what other things that you can do to help prevent falls. This information is not intended to replace advice given to you by your health care provider. Make sure you discuss any questions you have with your health care provider. Document Released: 05/07/2009 Document Revised: 12/17/2015 Document Reviewed: 08/15/2014 Elsevier Interactive Patient Education  2017 [employer].

## 2022-11-09 NOTE — Progress Notes (Signed)
I connected with  Wendy Parker on 11/09/22 by a audio enabled telemedicine application and verified that I am speaking with the correct person using two identifiers.  Patient Location: Home  Provider Location: Office/Clinic  I discussed the limitations of evaluation and management by telemedicine. The patient expressed understanding and agreed to proceed.  Subjective:   Wendy Parker is a 57 y.o. female who presents for Medicare Annual (Subsequent) preventive examination.  Patient Medicare AWV questionnaire was completed by the patient on 11/05/2022; I have confirmed that all information answered by patient is correct and no changes since this date.     Review of Systems     Cardiac Risk Factors include: obesity (BMI >30kg/m2)     Objective:    Today's Vitals   11/09/22 1441 11/09/22 1442  Weight: 277 lb (125.6 kg)   Height: 5\' 7"  (1.702 m)   PainSc:  6    Body mass index is 43.38 kg/m.     11/09/2022    2:47 PM 07/23/2021    8:32 AM 12/27/2014    9:27 AM  Advanced Directives  Does Patient Have a Medical Advance Directive? No No No  Would patient like information on creating a medical advance directive?  No - Patient declined No - patient declined information    Current Medications (verified) Outpatient Encounter Medications as of 11/09/2022  Medication Sig   Accu-Chek Softclix Lancets lancets 3 (three) times daily.   albuterol (VENTOLIN HFA) 108 (90 Base) MCG/ACT inhaler Inhale 2 puffs into the lungs every 6 (six) hours as needed for wheezing or shortness of breath. As needed   APPLE CIDER VINEGAR PO Take by mouth.   atorvastatin (LIPITOR) 40 MG tablet Take by mouth. daily   BLACK CURRANT SEED OIL PO Take by mouth.   Budesonide 90 MCG/ACT inhaler Inhale 2 puffs into the lungs 2 (two) times daily.   Multiple Vitamin (MULTIVITAMIN ADULT PO) Multivitamin   omeprazole (PRILOSEC) 40 MG capsule Take 40 mg by mouth daily.   RYBELSUS 14 MG TABS Take 1 tablet by mouth  daily.   TURMERIC PO Take by mouth.   No facility-administered encounter medications on file as of 11/09/2022.    Allergies (verified) Acetaminophen   History: Past Medical History:  Diagnosis Date   Allergy    Anxiety    Arthritis    Asthma    Carpal tunnel syndrome    Chronic kidney disease    Depression    Diabetes mellitus without complication    GERD (gastroesophageal reflux disease)    Hepatitis C    HIV infection    Sleep apnea    Past Surgical History:  Procedure Laterality Date   ABDOMINAL HYSTERECTOMY     FOOT SURGERY     FRACTURE SURGERY     SMALL BOWEL ENTEROSCOPY     TONSILLECTOMY     Family History  Problem Relation Age of Onset   Diabetes Mother    Diabetes Father    Cancer Father    Diabetes Sister    Social History   Socioeconomic History   Marital status: Single    Spouse name: Not on file   Number of children: Not on file   Years of education: Not on file   Highest education level: Not on file  Occupational History   Not on file  Tobacco Use   Smoking status: Former    Packs/day: 0.00    Years: 0.00    Additional pack years: 0.00  Total pack years: 0.00    Types: Cigarettes    Start date: 07/25/1973    Quit date: 07/25/2020    Years since quitting: 2.2   Smokeless tobacco: Never  Vaping Use   Vaping Use: Never used  Substance and Sexual Activity   Alcohol use: Not Currently   Drug use: Not Currently    Types: Marijuana   Sexual activity: Not Currently    Partners: Female    Birth control/protection: None  Other Topics Concern   Not on file  Social History Narrative   Not on file   Social Determinants of Health   Financial Resource Strain: Low Risk  (11/05/2022)   Overall Financial Resource Strain (CARDIA)    Difficulty of Paying Living Expenses: Not hard at all  Food Insecurity: No Food Insecurity (11/05/2022)   Hunger Vital Sign    Worried About Running Out of Food in the Last Year: Never true    Ran Out of Food in the  Last Year: Never true  Transportation Needs: No Transportation Needs (11/05/2022)   PRAPARE - Administrator, Civil Service (Medical): No    Lack of Transportation (Non-Medical): No  Physical Activity: Sufficiently Active (11/05/2022)   Exercise Vital Sign    Days of Exercise per Week: 4 days    Minutes of Exercise per Session: 40 min  Stress: No Stress Concern Present (11/05/2022)   [employer] of Occupational Health - Occupational Stress Questionnaire    Feeling of Stress : Not at all  Social Connections: Unknown (11/05/2022)   Social Connection and Isolation Panel [NHANES]    Frequency of Communication with Friends and Family: More than three times a week    Frequency of Social Gatherings with Friends and Family: Three times a week    Attends Religious Services: Not on file    Active Member of Clubs or Organizations: Yes    Attends Club or Organization Meetings: 1 to 4 times per year    Marital Status: Never married    Tobacco Counseling Counseling given: Not Answered   Clinical Intake:  Pre-visit preparation completed: Yes  Pain : 0-10 Pain Score: 6  Pain Type: Acute pain Pain Location: Back Pain Descriptors / Indicators: Sore, Aching Pain Onset: In the past 7 days Pain Frequency: Intermittent     Nutritional Status: BMI > 30  Obese Nutritional Risks: None Diabetes: No  How often do you need to have someone help you when you read instructions, pamphlets, or other written materials from your doctor or pharmacy?: 1 - Never  Diabetic? no  Interpreter Needed?: No  Information entered by :: NAllen LPN   Activities of Daily Living    11/05/2022    8:56 AM  In your present state of health, do you have any difficulty performing the following activities:  Hearing? 1  Vision? 1  Difficulty concentrating or making decisions? 0  Walking or climbing stairs? 0  Dressing or bathing? 0  Doing errands, shopping? 0  Preparing Food and eating ? N  Using  the Toilet? N  In the past six months, have you accidently leaked urine? N  Do you have problems with loss of bowel control? N  Managing your Medications? N  Managing your Finances? N  Housekeeping or managing your Housekeeping? N    Patient Care Team: Arnette Felts, FNP as PCP - General (General Practice)  Indicate any recent Medical Services you may have received from other than [facility] providers in the past year (date  may be approximate).     Assessment:   This is a routine wellness examination for [place].  Hearing/Vision screen Vision Screening - Comments:: Regular eye exams, [facility]  Dietary issues and exercise activities discussed: Current Exercise Habits: Home exercise routine, Type of exercise: walking, Time (Minutes): 40, Frequency (Times/Week): 4, Weekly Exercise (Minutes/Week): 160   Goals Addressed             This Visit's Progress    Patient Stated       11/09/2022, wants to weight 240-250 pounds       Depression Screen    11/09/2022    2:48 PM 10/11/2022   11:10 AM  PHQ 2/9 Scores  PHQ - 2 Score 0 3  PHQ- 9 Score  5    Fall Risk    11/05/2022    8:56 AM 10/11/2022   11:09 AM  Fall Risk   Falls in the past year? 0 0  Number falls in past yr: 0   Injury with Fall? 0   Risk for fall due to : Medication side effect   Follow up Falls prevention discussed;Education provided;Falls evaluation completed     FALL RISK PREVENTION PERTAINING TO THE HOME:  Any stairs in or around the home? Yes  If so, are there any without handrails? No  Home free of loose throw rugs in walkways, pet beds, electrical cords, etc? Yes  Adequate lighting in your home to reduce risk of falls? Yes   ASSISTIVE DEVICES UTILIZED TO PREVENT FALLS:  Life alert? No  Use of a cane, walker or w/c? Yes  Grab bars in the bathroom? No  Shower chair or bench in shower? No  Elevated toilet seat or a handicapped toilet? Yes   TIMED UP AND GO:  Was the test performed? No .       Cognitive Function:        11/09/2022    2:49 PM  6CIT Screen  What Year? 0 points  What month? 0 points  What time? 0 points  Count back from 20 0 points  Months in reverse 4 points  Repeat phrase 2 points  Total Score 6 points    Immunizations Immunization History  Administered Date(s) Administered   Influenza Inj Mdck Quad Pf 05/10/2021, 05/30/2022   Influenza-Unspecified 05/10/2021   Janssen (J&J) SARS-COV-2 Vaccination 11/04/2019   Pneumococcal Polysaccharide-23 09/14/2016    TDAP status: Due, Education has been provided regarding the importance of this vaccine. Advised may receive this vaccine at local pharmacy or Health Dept. Aware to provide a copy of the vaccination record if obtained from local pharmacy or Health Dept. Verbalized acceptance and understanding.  Flu Vaccine status: Up to date  Pneumococcal vaccine status: Up to date  Covid-19 vaccine status: Completed vaccines  Qualifies for Shingles Vaccine? Yes   Zostavax completed No   Shingrix Completed?: No.    Education has been provided regarding the importance of this vaccine. Patient has been advised to call insurance company to determine out of pocket expense if they have not yet received this vaccine. Advised may also receive vaccine at local pharmacy or Health Dept. Verbalized acceptance and understanding.  Screening Tests Health Maintenance  Topic Date Due   DTaP/Tdap/Td (1 - Tdap) Never done   PAP SMEAR-Modifier  Never done   Lung Cancer Screening  Never done   Zoster Vaccines- Shingrix (1 of 2) Never done   COVID-19 Vaccine (2 - 2023-24 season) 03/25/2022   Medicare Annual Wellness (AWV)  11/05/2022   INFLUENZA VACCINE  02/23/2023   MAMMOGRAM  09/21/2024   COLONOSCOPY (Pts 45-66yrs Insurance coverage will need to be confirmed)  09/15/2031   Hepatitis C Screening  Completed   HIV Screening  Completed   HPV VACCINES  Aged Out    Health Maintenance  Health Maintenance Due  Topic  Date Due   DTaP/Tdap/Td (1 - Tdap) Never done   PAP SMEAR-Modifier  Never done   Lung Cancer Screening  Never done   Zoster Vaccines- Shingrix (1 of 2) Never done   COVID-19 Vaccine (2 - 2023-24 season) 03/25/2022   Medicare Annual Wellness (AWV)  11/05/2022    Colorectal cancer screening: Type of screening: Colonoscopy. Completed 09/14/2021. Repeat every 10 years  Mammogram status: Completed 09/22/2022. Repeat every year  Bone Density status: n/a  Lung Cancer Screening: (Low Dose CT Chest recommended if Age 79-80 years, 30 pack-year currently smoking OR have quit w/in 15years.) does qualify.   Lung Cancer Screening Referral:  Scheduled for 11/14/2022  Additional Screening:  Hepatitis C Screening: does qualify; Completed 12/25/2017  Vision Screening: Recommended annual ophthalmology exams for early detection of glaucoma and other disorders of the eye. Is the patient up to date with their annual eye exam?  Yes  Who is the provider or what is the name of the office in which the patient attends annual eye exams? [facility] If pt is not established with a provider, would they like to be referred to a provider to establish care? No .   Dental Screening: Recommended annual dental exams for proper oral hygiene  Community Resource Referral / Chronic Care Management: CRR required this visit?  No   CCM required this visit?  No      Plan:     I have personally reviewed and noted the following in the patient's chart:   Medical and social history Use of alcohol, tobacco or illicit drugs  Current medications and supplements including opioid prescriptions. Patient is not currently taking opioid prescriptions. Functional ability and status Nutritional status Physical activity Advanced directives List of other physicians Hospitalizations, surgeries, and ER visits in previous 12 months Vitals Screenings to include cognitive, depression, and falls Referrals and appointments  In  addition, I have reviewed and discussed with patient certain preventive protocols, quality metrics, and best practice recommendations. A written personalized care plan for preventive services as well as general preventive health recommendations were provided to patient.     Barb Merino, LPN   [id number]/61/[id number]   Nurse Notes: none  Due to this being a virtual visit, the after visit summary with patients personalized plan was offered to patient via mail or my-chart.  Patient would like to access on my-chart

## 2022-11-14 ENCOUNTER — Inpatient Hospital Stay: Admission: RE | Admit: 2022-11-14 | Payer: 59 | Source: Ambulatory Visit

## 2022-11-14 ENCOUNTER — Other Ambulatory Visit: Payer: 59

## 2022-11-15 ENCOUNTER — Encounter: Payer: Self-pay | Admitting: Nurse Practitioner

## 2022-11-15 ENCOUNTER — Ambulatory Visit (INDEPENDENT_AMBULATORY_CARE_PROVIDER_SITE_OTHER): Payer: 59 | Admitting: Nurse Practitioner

## 2022-11-15 VITALS — BP 138/98 | HR 74 | Temp 97.8°F | Ht 67.0 in | Wt 277.0 lb

## 2022-11-15 DIAGNOSIS — J302 Other seasonal allergic rhinitis: Secondary | ICD-10-CM | POA: Diagnosis not present

## 2022-11-15 DIAGNOSIS — G5601 Carpal tunnel syndrome, right upper limb: Secondary | ICD-10-CM

## 2022-11-15 DIAGNOSIS — Z23 Encounter for immunization: Secondary | ICD-10-CM | POA: Diagnosis not present

## 2022-11-15 DIAGNOSIS — M79641 Pain in right hand: Secondary | ICD-10-CM

## 2022-11-15 MED ORDER — FLUTICASONE PROPIONATE 50 MCG/ACT NA SUSP
2.0000 | Freq: Every day | NASAL | 2 refills | Status: DC
Start: 2022-11-15 — End: 2024-02-07

## 2022-11-15 MED ORDER — TRIAMCINOLONE ACETONIDE 40 MG/ML IJ SUSP
40.0000 mg | Freq: Once | INTRAMUSCULAR | Status: AC
  Administered 2022-11-15: 40 mg via INTRAMUSCULAR

## 2022-11-15 MED ORDER — TETANUS-DIPHTH-ACELL PERTUSSIS 5-2.5-18.5 LF-MCG/0.5 IM SUSP
0.5000 mL | Freq: Once | INTRAMUSCULAR | 0 refills | Status: AC
Start: 2022-11-15 — End: 2022-11-15

## 2022-11-15 NOTE — Progress Notes (Signed)
I,Sheena H Holbrook,acting as a Neurosurgeon for Arnette Felts, FNP.,have documented all relevant documentation on the behalf of Arnette Felts, FNP,as directed by  Arnette Felts, FNP while in the presence of Arnette Felts, FNP.    Subjective:     Patient ID: Wendy Parker , female    DOB: Mar 01, 1966 , 57 y.o.   MRN: [id number]   Chief Complaint  Patient presents with   Hand Pain    right    HPI  Patient presents today for hand pain months ago. Patient states her right hand has been numb/tingling off and on; reports she was told she has carpal tunnel years ago. She has previously received cortisone shots and used a brace; she has not seen that provider in many years. She does not still currently use a brace. She has been taking OTC Turmeric for the pain.   She uses [employer] with her insurance.       Past Medical History:  Diagnosis Date   Allergy    Anxiety    Arthritis    Asthma    Carpal tunnel syndrome    Chronic kidney disease    Depression    Diabetes mellitus without complication (HCC)    GERD (gastroesophageal reflux disease)    Hepatitis C    HIV infection (HCC)    Sleep apnea      Family History  Problem Relation Age of Onset   Diabetes Mother    Diabetes Father    Cancer Father    Diabetes Sister      Current Outpatient Medications:    fluticasone (FLONASE) 50 MCG/ACT nasal spray, Place 2 sprays into both nostrils daily., Disp: 16 g, Rfl: 2   Accu-Chek Softclix Lancets lancets, 3 (three) times daily., Disp: , Rfl:    albuterol (VENTOLIN HFA) 108 (90 Base) MCG/ACT inhaler, Inhale 2 puffs into the lungs every 6 (six) hours as needed for wheezing or shortness of breath. As needed, Disp: 1 each, Rfl: 6   APPLE CIDER VINEGAR PO, Take by mouth., Disp: , Rfl:    atorvastatin (LIPITOR) 40 MG tablet, Take by mouth. daily, Disp: , Rfl:    BLACK CURRANT SEED OIL PO, Take by mouth., Disp: , Rfl:    Budesonide 90 MCG/ACT inhaler, Inhale 2 puffs into the lungs 2 (two) times  daily., Disp: 1 each, Rfl: 1   Multiple Vitamin (MULTIVITAMIN ADULT PO), Multivitamin, Disp: , Rfl:    omeprazole (PRILOSEC) 40 MG capsule, Take 40 mg by mouth daily., Disp: , Rfl:    RYBELSUS 14 MG TABS, Take 1 tablet by mouth daily., Disp: , Rfl:    TURMERIC PO, Take by mouth., Disp: , Rfl:    Allergies  Allergen Reactions   Acetaminophen Itching and Other (See Comments)    swelling     Review of Systems  Musculoskeletal:  Positive for arthralgias.       Right hand pain, tingling   All other systems reviewed and are negative.    Today's Vitals   11/15/22 1201 11/15/22 1238  BP: (!) 158/108 (!) 138/98  Pulse: 74   Temp: 97.8 F (36.6 C)   TempSrc: Oral   SpO2: 95%   Weight: 277 lb (125.6 kg)   Height: 5\' 7"  (1.702 m)   PainSc: 5    PainLoc: Hand    Body mass index is 43.38 kg/m.   Objective:  Physical Exam Vitals reviewed.  Constitutional:      General: She is not in acute distress.  Appearance: Normal appearance. She is well-developed. She is obese.  HENT:     Head: Normocephalic and atraumatic.  Eyes:     Pupils: Pupils are equal, round, and reactive to light.  Cardiovascular:     Rate and Rhythm: Normal rate and regular rhythm.     Pulses: Normal pulses.     Heart sounds: Normal heart sounds. No murmur heard. Pulmonary:     Effort: Pulmonary effort is normal. No respiratory distress.     Breath sounds: Normal breath sounds. No wheezing.  Musculoskeletal:        General: Normal range of motion.     Comments: Positive tinels and phalen's right hand  Skin:    General: Skin is warm and dry.     Capillary Refill: Capillary refill takes less than 2 seconds.  Neurological:     General: No focal deficit present.     Mental Status: She is alert and oriented to person, place, and time.     Cranial Nerves: No cranial nerve deficit.     Motor: No weakness.  Psychiatric:        Mood and Affect: Mood normal.         Assessment And Plan:     1. Hand pain,  right Comments: Reoccurrent hand pain affecting her ability to perform activities. Will refer to hand specialist per patient has history of carpal tunnel. positive Tinel and phalen's - Ambulatory referral to Hand Surgery  2. Carpal tunnel syndrome of right wrist - triamcinolone acetonide (KENALOG-40) injection 40 mg  3. Seasonal allergies - fluticasone (FLONASE) 50 MCG/ACT nasal spray; Place 2 sprays into both nostrils daily.  Dispense: 16 g; Refill: 2  4. Need for diphtheria-tetanus-pertussis (Tdap) vaccine Will give tetanus vaccine today while in office. Refer to order management. TDAP will be administered to adults 66-57 years old every 10 years.  - Tdap (BOOSTRIX) 5-2.5-18.5 LF-MCG/0.5 injection; Inject 0.5 mLs into the muscle once for 1 dose.  Dispense: 0.5 mL; Refill: 0 - Tdap vaccine greater than or equal to 7yo IM     Patient was given opportunity to ask questions. Patient verbalized understanding of the plan and was able to repeat key elements of the plan. All questions were answered to their satisfaction.  Arnette Felts, FNP   I, Arnette Felts, FNP, have reviewed all documentation for this visit. The documentation on 11/15/22 for the exam, diagnosis, procedures, and orders are all accurate and complete.   IF YOU HAVE BEEN REFERRED TO A SPECIALIST, IT MAY TAKE 1-2 WEEKS TO SCHEDULE/PROCESS THE REFERRAL. IF YOU HAVE NOT HEARD FROM US/SPECIALIST IN TWO WEEKS, PLEASE GIVE [place] A CALL AT [phone].   THE PATIENT IS ENCOURAGED TO PRACTICE SOCIAL DISTANCING DUE TO THE COVID-19 PANDEMIC.

## 2022-11-18 ENCOUNTER — Ambulatory Visit
Admission: RE | Admit: 2022-11-18 | Discharge: 2022-11-18 | Disposition: A | Discharge status: Home / Self Care | Payer: 59 | Source: Ambulatory Visit | Attending: Nurse Practitioner | Admitting: Nurse Practitioner

## 2022-11-18 DIAGNOSIS — Z87891 Personal history of nicotine dependence: Secondary | ICD-10-CM

## 2022-11-29 ENCOUNTER — Ambulatory Visit (INDEPENDENT_AMBULATORY_CARE_PROVIDER_SITE_OTHER): Payer: 59 | Admitting: Physician Assistant

## 2022-11-29 DIAGNOSIS — R202 Paresthesia of skin: Secondary | ICD-10-CM | POA: Diagnosis not present

## 2022-11-29 NOTE — Progress Notes (Signed)
Office Visit Note   Patient: Wendy Parker           Date of Birth: Jan 06, 1966           MRN: [id number] Visit Date: 11/29/2022              Requested by: Arnette Felts, FNP [address] [place],  [place] [address] PCP: Arnette Felts, FNP   Assessment & Plan: Visit Diagnoses:  1. Paresthesias in right hand     Plan: Impression is right hand paresthesias likely carpal tunnel syndrome.  At this point, she will continue wearing her wrist splint as this is helping.  Will refer her to Dr. Alvester Morin for right upper extremity nerve conduction study/EMG.  She will follow-up with [place] once this is been completed.  Call with concerns or questions in the meantime.  Follow-Up Instructions: Return for f/u after NCS.   Orders:  No orders of the defined types were placed in this encounter.  No orders of the defined types were placed in this encounter.     Procedures: No procedures performed   Clinical Data: No additional findings.   Subjective: Chief Complaint  Patient presents with   Other     Right hand numbness and tingling-eval for CTS    HPI patient is a pleasant 57 year old ambidextrous female who comes in today with right hand pain and paresthesias for the past 3 months which have progressively worsened.  The pain she has radiates throughout the wrist and sometimes into the volar forearm.  She has paresthesias into the thumb, index, long and ring fingers.  Symptoms are worse at night.  She has recently purchased a wrist splint which has seemed to help.  Review of Systems as detailed in HPI.  All others reviewed and are negative.   Objective: Vital Signs: There were no vitals taken for this visit.  Physical Exam well-developed well-nourished female no acute distress.  Alert and oriented x 3.  Ortho Exam right wrist exam reveals a positive Phalen and positive Tinel.  No thenar atrophy.  Fingers are warm well-perfused.  She is neurovascular intact  distally.  Specialty Comments:  No specialty comments available.  Imaging: No new imaging   PMFS History: Patient Active Problem List   Diagnosis Date Noted   Wheezing 08/05/2022   OSA (obstructive sleep apnea) 12/01/2021   Daytime sleepiness 12/01/2021   Morbid obesity with BMI of 45.0-49.9, adult (HCC) 12/01/2021   Abdominal bloating 06/14/2021   Mild persistent asthma without complication 03/02/2020   Hepatitis C antibody test positive 12/25/2017   Impingement syndrome of shoulder region 08/22/2017   Localized pain of right shoulder joint 08/22/2017   Bilateral carpal tunnel syndrome 08/15/2017   Cigarette smoker 01/20/2016   Laryngopharyngeal reflux (LPR) 01/20/2016   Major depression 03/07/2013   Chronic back pain 03/07/2013   Past Medical History:  Diagnosis Date   Allergy    Anxiety    Arthritis    Asthma    Carpal tunnel syndrome    Chronic kidney disease    Depression    Diabetes mellitus without complication (HCC)    GERD (gastroesophageal reflux disease)    Hepatitis C    HIV infection (HCC)    Sleep apnea     Family History  Problem Relation Age of Onset   Diabetes Mother    Diabetes Father    Cancer Father    Diabetes Sister     Past Surgical History:  Procedure Laterality Date  ABDOMINAL HYSTERECTOMY     FOOT SURGERY     FRACTURE SURGERY     SMALL BOWEL ENTEROSCOPY     TONSILLECTOMY     Social History   Occupational History   Not on file  Tobacco Use   Smoking status: Former    Packs/day: 0.00    Years: 0.00    Additional pack years: 0.00    Total pack years: 0.00    Types: Cigarettes    Start date: 07/25/1973    Quit date: 07/25/2020    Years since quitting: 2.3   Smokeless tobacco: Never  Vaping Use   Vaping Use: Never used  Substance and Sexual Activity   Alcohol use: Not Currently   Drug use: Not Currently    Types: Marijuana   Sexual activity: Not Currently    Partners: Female    Birth control/protection: None

## 2022-12-05 ENCOUNTER — Telehealth: Payer: Self-pay | Admitting: Physical Medicine and Rehabilitation

## 2022-12-05 NOTE — Telephone Encounter (Signed)
Pt called requesting a call back. Has question about her upcoming nerve study test. Pt is asking what is a nerve study and what to except. Please call pt at [phone]

## 2022-12-05 NOTE — Telephone Encounter (Signed)
Spoke with patient and she wanted to cancel appointment. Patient wants to see how the glove healp her pain first

## 2022-12-06 ENCOUNTER — Encounter: Payer: 59 | Admitting: Physical Medicine and Rehabilitation

## 2022-12-28 ENCOUNTER — Ambulatory Visit: Payer: Self-pay | Admitting: Nurse Practitioner

## 2023-01-10 NOTE — Telephone Encounter (Signed)
Patient checking on message about mouth piece. Patient uses [facility]. Patient phone number is [phone].

## 2023-02-06 NOTE — Telephone Encounter (Signed)
Pt. Calling back to check on an answer an has not gotten a response in almost a mon. Please advise if the referral will be placed

## 2023-02-06 NOTE — Telephone Encounter (Signed)
I'm sorry about that the message was not directed towards me, we will need to refer her to orthodontics (please order if she would like referral placed re: OSA)  Recommend Dr. Irene Limbo or Dr. Loraine Leriche Katz/ She can also check with her dentist if they do oral appliance for treatment of OSA

## 2023-02-08 ENCOUNTER — Other Ambulatory Visit: Payer: Self-pay

## 2023-02-08 DIAGNOSIS — G4733 Obstructive sleep apnea (adult) (pediatric): Secondary | ICD-10-CM

## 2023-02-09 NOTE — Telephone Encounter (Signed)
Pt has called me and states she has a video visit with them on 7/31 and at that time they will let her know where to send the order.  We are to hold it until she contacts [place] to let us know where to send it.

## 2023-02-09 NOTE — Telephone Encounter (Signed)
I have the oral appliance order to go to [facility].  I don't have any contact info for them.  I have called pt & left her a vm to call me with contact info.  Will route back to triage so pt can be made aware thru MyChart that contact info is needed for [facility].

## 2023-02-15 ENCOUNTER — Ambulatory Visit (INDEPENDENT_AMBULATORY_CARE_PROVIDER_SITE_OTHER): Payer: 59 | Admitting: Nurse Practitioner

## 2023-02-15 VITALS — BP 110/70 | HR 80 | Temp 98.4°F | Ht 67.0 in | Wt 289.6 lb

## 2023-02-15 DIAGNOSIS — I700 Atherosclerosis of aorta: Secondary | ICD-10-CM | POA: Diagnosis not present

## 2023-02-15 DIAGNOSIS — Z2821 Immunization not carried out because of patient refusal: Secondary | ICD-10-CM | POA: Insufficient documentation

## 2023-02-15 DIAGNOSIS — R7303 Prediabetes: Secondary | ICD-10-CM | POA: Diagnosis not present

## 2023-02-15 DIAGNOSIS — G4733 Obstructive sleep apnea (adult) (pediatric): Secondary | ICD-10-CM | POA: Diagnosis not present

## 2023-02-15 DIAGNOSIS — Z6841 Body Mass Index (BMI) 40.0 and over, adult: Secondary | ICD-10-CM

## 2023-02-15 NOTE — Assessment & Plan Note (Signed)
  Chronic, controlled Continue with current medications Encouraged to limit intake of sugary foods and drinks Encouraged to increase physical activity to 150 minutes per week Suggestion is made for her to avoid simple carbohydrates from her diet including Cakes, Sweet Desserts, Ice Cream, Soda (diet and regular), Sweet Tea, Candies, Chips, Cookies, Store Bought Juices, Alcohol in Excess of 1-2 drinks a day, Artificial Sweeteners, Coffee Creamer, and "Sugar-free" Products. This will help patient to have more stable blood glucose

## 2023-02-15 NOTE — Assessment & Plan Note (Addendum)
She has gained approximately 12 lbs since her last visit but is aware of why, plans to start back exercising and eating healthy diet. She is encouraged to strive for BMI less than 30 to decrease cardiac risk. Advised to aim for at least 150 minutes of exercise per week.

## 2023-02-15 NOTE — Assessment & Plan Note (Signed)

## 2023-02-15 NOTE — Progress Notes (Signed)
Madelaine Bhat, CMA,acting as a Neurosurgeon for Arnette Felts, FNP.,have documented all relevant documentation on the behalf of Arnette Felts, FNP,as directed by  Arnette Felts, FNP while in the presence of Arnette Felts, FNP.  Subjective:  Patient ID: Wendy Parker , female    DOB: Dec 05, 1965 , 57 y.o.   MRN: [id number]  Chief Complaint  Patient presents with   Diabetes    HPI  Patient presents today for a DM follow up, patient reports compliance with medications. Patient denies any chest pains, SOB, or headaches.  Patient has no other concerns today.   BP Readings from Last 3 Encounters: 02/15/23 : 110/70 11/15/22 : (!) 138/98 11/07/22 : 122/76   Wt Readings from Last 3 Encounters: 02/15/23 : 289 lb 9.6 oz (131.4 kg) 11/15/22 : 277 lb (125.6 kg) 11/09/22 : 277 lb (125.6 kg)       Past Medical History:  Diagnosis Date   Allergy    Anxiety    Arthritis    Asthma    Carpal tunnel syndrome    Chronic kidney disease    Depression    Diabetes mellitus without complication (HCC)    GERD (gastroesophageal reflux disease)    Hepatitis C    HIV infection (HCC)    Sleep apnea      Family History  Problem Relation Age of Onset   Diabetes Mother    Diabetes Father    Cancer Father    Diabetes Sister      Current Outpatient Medications:    Accu-Chek Softclix Lancets lancets, 3 (three) times daily., Disp: , Rfl:    albuterol (VENTOLIN HFA) 108 (90 Base) MCG/ACT inhaler, Inhale 2 puffs into the lungs every 6 (six) hours as needed for wheezing or shortness of breath. As needed, Disp: 1 each, Rfl: 6   APPLE CIDER VINEGAR PO, Take by mouth., Disp: , Rfl:    atorvastatin (LIPITOR) 40 MG tablet, Take by mouth. daily, Disp: , Rfl:    BLACK CURRANT SEED OIL PO, Take by mouth., Disp: , Rfl:    Budesonide 90 MCG/ACT inhaler, Inhale 2 puffs into the lungs 2 (two) times daily., Disp: 1 each, Rfl: 1   fluticasone (FLONASE) 50 MCG/ACT nasal spray, Place 2 sprays into both nostrils daily.,  Disp: 16 g, Rfl: 2   Multiple Vitamin (MULTIVITAMIN ADULT PO), Multivitamin, Disp: , Rfl:    omeprazole (PRILOSEC) 40 MG capsule, Take 40 mg by mouth daily., Disp: , Rfl:    RYBELSUS 14 MG TABS, Take 1 tablet by mouth daily., Disp: , Rfl:    TURMERIC PO, Take by mouth., Disp: , Rfl:    Allergies  Allergen Reactions   Acetaminophen Itching and Other (See Comments)    swelling     Review of Systems  Constitutional: Negative.  Negative for activity change and fatigue.  Eyes:  Negative for visual disturbance.  Respiratory: Negative.  Negative for choking, shortness of breath and wheezing.   Cardiovascular: Negative.  Negative for chest pain, palpitations and leg swelling.  Gastrointestinal: Negative.   Endocrine: Negative.  Negative for polydipsia, polyphagia and polyuria.  Musculoskeletal:  Negative for arthralgias.  Skin: Negative.   Neurological:  Negative for dizziness, weakness and headaches.  Psychiatric/Behavioral:  Negative for confusion. The patient is not nervous/anxious.      Today's Vitals   02/15/23 1128  BP: 110/70  Pulse: 80  Temp: 98.4 F (36.9 C)  TempSrc: Oral  Weight: 289 lb 9.6 oz (131.4 kg)  Height: 5\' 7"  (  1.702 m)  PainSc: 0-No pain   Body mass index is 45.36 kg/m.  Wt Readings from Last 3 Encounters:  02/15/23 289 lb 9.6 oz (131.4 kg)  11/15/22 277 lb (125.6 kg)  11/09/22 277 lb (125.6 kg)      Objective:  Physical Exam Vitals reviewed.  Constitutional:      General: She is not in acute distress.    Appearance: Normal appearance. She is well-developed. She is obese.  Eyes:     Pupils: Pupils are equal, round, and reactive to light.  Cardiovascular:     Rate and Rhythm: Normal rate and regular rhythm.     Pulses: Normal pulses.     Heart sounds: Normal heart sounds. No murmur heard. Pulmonary:     Effort: Pulmonary effort is normal. No respiratory distress.     Breath sounds: Normal breath sounds. No wheezing.  Musculoskeletal:         General: Normal range of motion.     Comments: Splint has been effective for her right hand pain  Skin:    General: Skin is warm and dry.     Capillary Refill: Capillary refill takes less than 2 seconds.  Neurological:     General: No focal deficit present.     Mental Status: She is alert and oriented to person, place, and time.     Cranial Nerves: No cranial nerve deficit.     Motor: No weakness.  Psychiatric:        Mood and Affect: Mood normal.         Assessment And Plan:  Prediabetes Assessment & Plan:  Chronic, controlled Continue with current medications Encouraged to limit intake of sugary foods and drinks Encouraged to increase physical activity to 150 minutes per week Suggestion is made for her to avoid simple carbohydrates from her diet including Cakes, Sweet Desserts, Ice Cream, Soda (diet and regular), Sweet Tea, Candies, Chips, Cookies, Store Bought Juices, Alcohol in Excess of 1-2 drinks a day, Artificial Sweeteners, Coffee Creamer, and "Sugar-free" Products. This will help patient to have more stable blood glucose    Orders: -     Hemoglobin A1c  COVID-19 vaccination declined Assessment & Plan: Declines covid 19 vaccine. Discussed risk of covid 40 and if she changes her mind about the vaccine to call the office. Education has been provided regarding the importance of this vaccine but patient still declined. Advised may receive this vaccine at local pharmacy or Health Dept.or vaccine clinic. Aware to provide a copy of the vaccination record if obtained from local pharmacy or Health Dept.  Encouraged to take multivitamin, vitamin d, vitamin c and zinc to increase immune system. Aware can call office if would like to have vaccine here at office. Verbalized acceptance and understanding.    Atherosclerosis of aorta (HCC) Assessment & Plan: Continue statin, tolerating well.    OSA (obstructive sleep apnea) Assessment & Plan: She is unable to use a CPAP; she is in  the process of getting a dental device   Herpes zoster vaccination declined Assessment & Plan: Declines shingrix, educated on disease process and is aware if he changes his mind to notify office    Morbid obesity with BMI of 45.0-49.9, adult [facility]) Assessment & Plan: She has gained approximately 12 lbs since her last visit but is aware of why, plans to start back exercising and eating healthy diet. She is encouraged to strive for BMI less than 30 to decrease cardiac risk. Advised to aim for at least  150 minutes of exercise per week.      Return for controlled DM check 4 months.  Patient was given opportunity to ask questions. Patient verbalized understanding of the plan and was able to repeat key elements of the plan. All questions were answered to their satisfaction.    Jeanell Sparrow, FNP, have reviewed all documentation for this visit. The documentation on 02/15/23 for the exam, diagnosis, procedures, and orders are all accurate and complete.   IF YOU HAVE BEEN REFERRED TO A SPECIALIST, IT MAY TAKE 1-2 WEEKS TO SCHEDULE/PROCESS THE REFERRAL. IF YOU HAVE NOT HEARD FROM US/SPECIALIST IN TWO WEEKS, PLEASE GIVE [place] A CALL AT [phone].

## 2023-02-15 NOTE — Assessment & Plan Note (Signed)
She is unable to use a CPAP; she is in the process of getting a dental device

## 2023-02-15 NOTE — Assessment & Plan Note (Signed)
Continue statin, tolerating well 

## 2023-02-15 NOTE — Assessment & Plan Note (Signed)
Declines shingrix, educated on disease process and is aware if he changes his mind to notify office  

## 2023-02-16 LAB — HEMOGLOBIN A1C
Est. average glucose Bld gHb Est-mCnc: 126 mg/dL
Hgb A1c MFr Bld: 6 % — ABNORMAL HIGH (ref 4.8–5.6)

## 2023-03-16 ENCOUNTER — Other Ambulatory Visit: Payer: Self-pay | Admitting: Physician Assistant

## 2023-03-16 DIAGNOSIS — M79662 Pain in left lower leg: Secondary | ICD-10-CM

## 2023-03-17 ENCOUNTER — Ambulatory Visit
Admission: RE | Admit: 2023-03-17 | Discharge: 2023-03-17 | Disposition: A | Discharge status: Home / Self Care | Payer: 59 | Source: Ambulatory Visit | Attending: Physician Assistant | Admitting: Physician Assistant

## 2023-03-17 DIAGNOSIS — M79662 Pain in left lower leg: Secondary | ICD-10-CM | POA: Insufficient documentation

## 2023-04-06 ENCOUNTER — Other Ambulatory Visit: Payer: Self-pay | Admitting: Nurse Practitioner

## 2023-05-01 ENCOUNTER — Encounter: Payer: Self-pay | Admitting: Primary Care

## 2023-05-09 ENCOUNTER — Ambulatory Visit: Payer: 59 | Admitting: Primary Care

## 2023-06-05 ENCOUNTER — Encounter: Payer: Self-pay | Admitting: Nurse Practitioner

## 2023-06-05 ENCOUNTER — Ambulatory Visit: Payer: 59 | Admitting: Nurse Practitioner

## 2023-06-05 VITALS — BP 132/68 | HR 84 | Temp 98.5°F | Ht 67.0 in | Wt 305.8 lb

## 2023-06-05 DIAGNOSIS — E66813 Obesity, class 3: Secondary | ICD-10-CM

## 2023-06-05 DIAGNOSIS — I700 Atherosclerosis of aorta: Secondary | ICD-10-CM

## 2023-06-05 DIAGNOSIS — Z2821 Immunization not carried out because of patient refusal: Secondary | ICD-10-CM

## 2023-06-05 DIAGNOSIS — N898 Other specified noninflammatory disorders of vagina: Secondary | ICD-10-CM

## 2023-06-05 DIAGNOSIS — R7303 Prediabetes: Secondary | ICD-10-CM

## 2023-06-05 DIAGNOSIS — Z6841 Body Mass Index (BMI) 40.0 and over, adult: Secondary | ICD-10-CM

## 2023-06-05 DIAGNOSIS — Z23 Encounter for immunization: Secondary | ICD-10-CM | POA: Diagnosis not present

## 2023-06-05 DIAGNOSIS — M25571 Pain in right ankle and joints of right foot: Secondary | ICD-10-CM

## 2023-06-05 DIAGNOSIS — G8929 Other chronic pain: Secondary | ICD-10-CM

## 2023-06-05 DIAGNOSIS — M545 Low back pain, unspecified: Secondary | ICD-10-CM

## 2023-06-05 DIAGNOSIS — J4530 Mild persistent asthma, uncomplicated: Secondary | ICD-10-CM | POA: Diagnosis not present

## 2023-06-05 LAB — POCT URINALYSIS DIP (CLINITEK)
Bilirubin, UA: NEGATIVE
Blood, UA: NEGATIVE
Glucose, UA: NEGATIVE mg/dL
Ketones, POC UA: NEGATIVE mg/dL
Leukocytes, UA: NEGATIVE
Nitrite, UA: NEGATIVE
POC PROTEIN,UA: NEGATIVE
Spec Grav, UA: 1.025
Urobilinogen, UA: 0.2 U/dL
pH, UA: 7

## 2023-06-05 MED ORDER — METRONIDAZOLE 500 MG PO TABS
500.0000 mg | ORAL_TABLET | Freq: Three times a day (TID) | ORAL | 0 refills | Status: AC
Start: 2023-06-05 — End: 2023-06-15

## 2023-06-05 MED ORDER — BUDESONIDE 90 MCG/ACT IN AEPB: 2.0000 | INHALATION_SPRAY | Freq: Two times a day (BID) | RESPIRATORY_TRACT | 1 refills | Status: DC

## 2023-06-05 MED ORDER — ALBUTEROL SULFATE HFA 108 (90 BASE) MCG/ACT IN AERS: 2.0000 | INHALATION_SPRAY | Freq: Four times a day (QID) | RESPIRATORY_TRACT | 6 refills | Status: DC | PRN

## 2023-06-05 MED ORDER — FLUCONAZOLE 100 MG PO TABS
100.0000 mg | ORAL_TABLET | Freq: Every day | ORAL | 0 refills | Status: DC
Start: 2023-06-05 — End: 2023-09-05

## 2023-06-05 NOTE — Progress Notes (Signed)
Madelaine Bhat, CMA,acting as a Neurosurgeon for Arnette Felts, FNP.,have documented all relevant documentation on the behalf of Arnette Felts, FNP,as directed by  Arnette Felts, FNP while in the presence of Arnette Felts, FNP.  Subjective:  Patient ID: Wendy Parker , female    DOB: 03-26-1966 , 57 y.o.   MRN: [id number]  Chief Complaint  Patient presents with   Diabetes    HPI  Patient presents today for a dm follow up, Patient reports compliance with medication. Patient denies any chest pain, SOB, or headaches. Patient would also like a referral to physical therapy with Ellamae Sia. She reports her lower back issues and foot issues. Patient reports she as back pain and stomach bloating, she reports she has vaginal itch and odor.  She is no longer seeing the pulmonologist      Past Medical History:  Diagnosis Date   Allergy    Anxiety    Arthritis    Asthma    Carpal tunnel syndrome    Chronic kidney disease    Depression    Diabetes mellitus without complication (HCC)    GERD (gastroesophageal reflux disease)    Hepatitis C    HIV infection (HCC)    Sleep apnea      Family History  Problem Relation Age of Onset   Diabetes Mother    Diabetes Father    Cancer Father    Diabetes Sister      Current Outpatient Medications:    Accu-Chek Softclix Lancets lancets, 3 (three) times daily., Disp: , Rfl:    APPLE CIDER VINEGAR PO, Take by mouth., Disp: , Rfl:    atorvastatin (LIPITOR) 40 MG tablet, TAKE ONE TABLET BY MOUTH ONCE A DAY, Disp: 90 tablet, Rfl: 1   BLACK CURRANT SEED OIL PO, Take by mouth., Disp: , Rfl:    fluconazole (DIFLUCAN) 100 MG tablet, Take 1 tablet (100 mg total) by mouth daily. Take 1 tablet by mouth now repeat in 5 days, Disp: 2 tablet, Rfl: 0   fluticasone (FLONASE) 50 MCG/ACT nasal spray, Place 2 sprays into both nostrils daily., Disp: 16 g, Rfl: 2   metroNIDAZOLE (FLAGYL) 500 MG tablet, Take 1 tablet (500 mg total) by mouth 3 (three) times daily for 10  days., Disp: 30 tablet, Rfl: 0   Multiple Vitamin (MULTIVITAMIN ADULT PO), Multivitamin, Disp: , Rfl:    omeprazole (PRILOSEC) 40 MG capsule, Take 40 mg by mouth daily., Disp: , Rfl:    RYBELSUS 14 MG TABS, Take 1 tablet by mouth daily., Disp: , Rfl:    TURMERIC PO, Take by mouth., Disp: , Rfl:    albuterol (VENTOLIN HFA) 108 (90 Base) MCG/ACT inhaler, Inhale 2 puffs into the lungs every 6 (six) hours as needed for wheezing or shortness of breath. As needed, Disp: 1 each, Rfl: 6   Budesonide 90 MCG/ACT inhaler, Inhale 2 puffs into the lungs 2 (two) times daily., Disp: 1 each, Rfl: 1   Allergies  Allergen Reactions   Acetaminophen Itching and Other (See Comments)    swelling     Review of Systems  Constitutional: Negative.   HENT: Negative.    Eyes: Negative.   Respiratory: Negative.    Cardiovascular: Negative.   Gastrointestinal:  Positive for constipation. Negative for vomiting.  Genitourinary:        Vaginal itching and vaginal odor   Neurological: Negative.   Psychiatric/Behavioral: Negative.       Today's Vitals   06/05/23 1213  BP: 132/68  Pulse: 84  Temp: 98.5 F (36.9 C)  TempSrc: Oral  Weight: (!) 305 lb 12.8 oz (138.7 kg)  Height: 5\' 7"  (1.702 m)  PainSc: 8   PainLoc: Abdomen   Body mass index is 47.9 kg/m.  Wt Readings from Last 3 Encounters:  06/05/23 (!) 305 lb 12.8 oz (138.7 kg)  02/15/23 289 lb 9.6 oz (131.4 kg)  11/15/22 277 lb (125.6 kg)    The ASCVD Risk score (Arnett DK, et al., 2019) failed to calculate for the following reasons:   The valid total cholesterol range is 130 to 320 mg/dL  Objective:  Physical Exam Vitals reviewed.  Constitutional:      General: She is not in acute distress.    Appearance: Normal appearance. She is well-developed. She is obese.  Eyes:     Pupils: Pupils are equal, round, and reactive to light.  Cardiovascular:     Rate and Rhythm: Normal rate and regular rhythm.     Pulses: Normal pulses.     Heart sounds:  Normal heart sounds. No murmur heard. Pulmonary:     Effort: Pulmonary effort is normal. No respiratory distress.     Breath sounds: Normal breath sounds. No wheezing.  Musculoskeletal:        General: Normal range of motion.     Comments: Splint has been effective for her right hand pain  Skin:    General: Skin is warm and dry.     Capillary Refill: Capillary refill takes less than 2 seconds.  Neurological:     General: No focal deficit present.     Mental Status: She is alert and oriented to person, place, and time.     Cranial Nerves: No cranial nerve deficit.     Motor: No weakness.  Psychiatric:        Mood and Affect: Mood normal.         Assessment And Plan:  Prediabetes Assessment & Plan:  Chronic, controlled Continue with current medications Encouraged to limit intake of sugary foods and drinks Encouraged to increase physical activity to 150 minutes per week    Orders: -     Hemoglobin A1c  Vaginal itching Assessment & Plan: Will perform nuswab. Will also treat for possible BV and yeast infection  Orders: -     POCT URINALYSIS DIP (CLINITEK) -     Fluconazole; Take 1 tablet (100 mg total) by mouth daily. Take 1 tablet by mouth now repeat in 5 days  Dispense: 2 tablet; Refill: 0 -     metroNIDAZOLE; Take 1 tablet (500 mg total) by mouth 3 (three) times daily for 10 days.  Dispense: 30 tablet; Refill: 0  Vaginal odor -     POCT URINALYSIS DIP (CLINITEK) -     Fluconazole; Take 1 tablet (100 mg total) by mouth daily. Take 1 tablet by mouth now repeat in 5 days  Dispense: 2 tablet; Refill: 0 -     metroNIDAZOLE; Take 1 tablet (500 mg total) by mouth 3 (three) times daily for 10 days.  Dispense: 30 tablet; Refill: 0  Chronic bilateral low back pain without sciatica Assessment & Plan: She continues to have back pain, will refer to PT at patient request  Orders: -     NuSwab Vaginitis Plus (VG+) -     Ambulatory referral to Physical Therapy  Atherosclerosis  of aorta [facility]) Assessment & Plan: Continue statin, tolerating well.    Mild persistent asthma without complication Assessment & Plan: Refilled her medications  inhaled steroids and as needed albuterol inhaler. She is no longer seeing the pulmonologist   Chronic pain of right ankle -     Ambulatory referral to Physical Therapy  COVID-19 vaccination declined Assessment & Plan: Declines covid 19 vaccine. Discussed risk of covid 73 and if she changes her mind about the vaccine to call the office. Education has been provided regarding the importance of this vaccine but patient still declined. Advised may receive this vaccine at local pharmacy or Health Dept.or vaccine clinic. Aware to provide a copy of the vaccination record if obtained from local pharmacy or Health Dept.  Encouraged to take multivitamin, vitamin d, vitamin c and zinc to increase immune system. Aware can call office if would like to have vaccine here at office. Verbalized acceptance and understanding.    Need for influenza vaccination Assessment & Plan: Influenza vaccine administered Encouraged to take Tylenol as needed for fever or muscle aches.   Orders: -     Flu vaccine trivalent PF, 6mos and older(Flulaval,Afluria,Fluarix,Fluzone)  Class 3 severe obesity due to excess calories with body mass index (BMI) of 45.0 to 49.9 in adult, unspecified whether serious comorbidity present [facility]) Assessment & Plan: She is encouraged to strive for BMI less than 30 to decrease cardiac risk. Advised to aim for at least 150 minutes of exercise per week. Will refer to [facility]  Orders: -     Amb Ref to Medical Weight Management  Other orders -     Albuterol Sulfate HFA; Inhale 2 puffs into the lungs every 6 (six) hours as needed for wheezing or shortness of breath. As needed  Dispense: 1 each; Refill: 6 -     Budesonide; Inhale 2 puffs into the lungs 2 (two) times daily.  Dispense: 1 each; Refill: 1   No  follow-ups on file.  Patient was given opportunity to ask questions. Patient verbalized understanding of the plan and was able to repeat key elements of the plan. All questions were answered to their satisfaction.    Jeanell Sparrow, FNP, have reviewed all documentation for this visit. The documentation on 06/05/23 for the exam, diagnosis, procedures, and orders are all accurate and complete.   IF YOU HAVE BEEN REFERRED TO A SPECIALIST, IT MAY TAKE 1-2 WEEKS TO SCHEDULE/PROCESS THE REFERRAL. IF YOU HAVE NOT HEARD FROM US/SPECIALIST IN TWO WEEKS, PLEASE GIVE [place] A CALL AT [phone].

## 2023-06-06 LAB — HEMOGLOBIN A1C
Est. average glucose Bld gHb Est-mCnc: 134 mg/dL
Hgb A1c MFr Bld: 6.3 % — ABNORMAL HIGH (ref 4.8–5.6)

## 2023-06-07 LAB — NUSWAB VAGINITIS PLUS (VG+)
Candida albicans, NAA: NEGATIVE
Candida glabrata, NAA: NEGATIVE
Chlamydia trachomatis, NAA: NEGATIVE
Neisseria gonorrhoeae, NAA: NEGATIVE
Trich vag by NAA: NEGATIVE

## 2023-06-09 ENCOUNTER — Other Ambulatory Visit: Payer: Self-pay | Admitting: Nurse Practitioner

## 2023-06-09 DIAGNOSIS — N898 Other specified noninflammatory disorders of vagina: Secondary | ICD-10-CM

## 2023-06-13 DIAGNOSIS — N898 Other specified noninflammatory disorders of vagina: Secondary | ICD-10-CM | POA: Insufficient documentation

## 2023-06-13 DIAGNOSIS — Z23 Encounter for immunization: Secondary | ICD-10-CM | POA: Insufficient documentation

## 2023-06-13 DIAGNOSIS — G8929 Other chronic pain: Secondary | ICD-10-CM | POA: Insufficient documentation

## 2023-06-13 NOTE — Assessment & Plan Note (Signed)
Refilled her medications inhaled steroids and as needed albuterol inhaler. She is no longer seeing the pulmonologist

## 2023-06-13 NOTE — Assessment & Plan Note (Signed)
Continue statin, tolerating well 

## 2023-06-13 NOTE — Assessment & Plan Note (Signed)
She is encouraged to strive for BMI less than 30 to decrease cardiac risk. Advised to aim for at least 150 minutes of exercise per week. Will refer to [facility]

## 2023-06-13 NOTE — Assessment & Plan Note (Signed)
She continues to have back pain, will refer to PT at patient request

## 2023-06-13 NOTE — Assessment & Plan Note (Signed)

## 2023-06-13 NOTE — Assessment & Plan Note (Signed)
Will perform nuswab. Will also treat for possible BV and yeast infection

## 2023-06-13 NOTE — Assessment & Plan Note (Signed)
Influenza vaccine administered Encouraged to take Tylenol as needed for fever or muscle aches.

## 2023-06-13 NOTE — Assessment & Plan Note (Signed)
  Chronic, controlled Continue with current medications Encouraged to limit intake of sugary foods and drinks Encouraged to increase physical activity to 150 minutes per week

## 2023-06-15 ENCOUNTER — Other Ambulatory Visit: Payer: Self-pay | Admitting: Nurse Practitioner

## 2023-06-15 MED ORDER — METFORMIN HCL 500 MG PO TABS: ORAL_TABLET | ORAL | 1 refills | Status: DC

## 2023-06-20 ENCOUNTER — Ambulatory Visit: Payer: 59 | Admitting: Nurse Practitioner

## 2023-06-30 DIAGNOSIS — G4733 Obstructive sleep apnea (adult) (pediatric): Secondary | ICD-10-CM | POA: Diagnosis not present

## 2023-07-07 DIAGNOSIS — M25571 Pain in right ankle and joints of right foot: Secondary | ICD-10-CM | POA: Diagnosis not present

## 2023-07-07 DIAGNOSIS — R269 Unspecified abnormalities of gait and mobility: Secondary | ICD-10-CM | POA: Diagnosis not present

## 2023-07-07 DIAGNOSIS — G8929 Other chronic pain: Secondary | ICD-10-CM | POA: Diagnosis not present

## 2023-07-07 DIAGNOSIS — M545 Low back pain, unspecified: Secondary | ICD-10-CM | POA: Diagnosis not present

## 2023-08-04 DIAGNOSIS — M545 Low back pain, unspecified: Secondary | ICD-10-CM | POA: Diagnosis not present

## 2023-08-04 DIAGNOSIS — G8929 Other chronic pain: Secondary | ICD-10-CM | POA: Diagnosis not present

## 2023-08-14 ENCOUNTER — Other Ambulatory Visit: Payer: Self-pay | Admitting: Nurse Practitioner

## 2023-08-15 ENCOUNTER — Other Ambulatory Visit: Payer: Self-pay | Admitting: Nurse Practitioner

## 2023-08-15 ENCOUNTER — Other Ambulatory Visit: Payer: Self-pay

## 2023-08-15 DIAGNOSIS — Z1231 Encounter for screening mammogram for malignant neoplasm of breast: Secondary | ICD-10-CM

## 2023-08-15 MED ORDER — RYBELSUS 14 MG PO TABS: 1.0000 | ORAL_TABLET | Freq: Every day | ORAL | 2 refills | Status: DC

## 2023-08-15 MED ORDER — OMEPRAZOLE 40 MG PO CPDR: 40.0000 mg | DELAYED_RELEASE_CAPSULE | Freq: Every day | ORAL | 2 refills | Status: DC

## 2023-08-24 DIAGNOSIS — M545 Low back pain, unspecified: Secondary | ICD-10-CM | POA: Diagnosis not present

## 2023-08-24 DIAGNOSIS — G8929 Other chronic pain: Secondary | ICD-10-CM | POA: Diagnosis not present

## 2023-09-05 ENCOUNTER — Encounter (INDEPENDENT_AMBULATORY_CARE_PROVIDER_SITE_OTHER): Payer: Self-pay

## 2023-09-05 ENCOUNTER — Ambulatory Visit (INDEPENDENT_AMBULATORY_CARE_PROVIDER_SITE_OTHER): Payer: 59 | Admitting: Nurse Practitioner

## 2023-09-05 ENCOUNTER — Encounter: Payer: Self-pay | Admitting: Nurse Practitioner

## 2023-09-05 VITALS — BP 110/70 | HR 85 | Temp 98.5°F | Ht 67.0 in | Wt 310.8 lb

## 2023-09-05 DIAGNOSIS — Z2821 Immunization not carried out because of patient refusal: Secondary | ICD-10-CM | POA: Diagnosis not present

## 2023-09-05 DIAGNOSIS — R7303 Prediabetes: Secondary | ICD-10-CM

## 2023-09-05 DIAGNOSIS — N898 Other specified noninflammatory disorders of vagina: Secondary | ICD-10-CM | POA: Diagnosis not present

## 2023-09-05 DIAGNOSIS — I700 Atherosclerosis of aorta: Secondary | ICD-10-CM | POA: Diagnosis not present

## 2023-09-05 DIAGNOSIS — Z6841 Body Mass Index (BMI) 40.0 and over, adult: Secondary | ICD-10-CM

## 2023-09-05 DIAGNOSIS — Z23 Encounter for immunization: Secondary | ICD-10-CM | POA: Diagnosis not present

## 2023-09-05 DIAGNOSIS — E66813 Obesity, class 3: Secondary | ICD-10-CM | POA: Diagnosis not present

## 2023-09-05 MED ORDER — FLUCONAZOLE 100 MG PO TABS: 100.0000 mg | ORAL_TABLET | Freq: Every day | ORAL | 0 refills | Status: DC

## 2023-09-05 NOTE — Patient Instructions (Addendum)
You need to go to [address] to have your xray done of your stomach.  [facility] - call them for an appt - [phone]

## 2023-09-05 NOTE — Progress Notes (Signed)
Madelaine Bhat, CMA,acting as a Neurosurgeon for Arnette Felts, FNP.,have documented all relevant documentation on the behalf of Arnette Felts, FNP,as directed by  Arnette Felts, FNP while in the presence of Arnette Felts, FNP.  Subjective:  Patient ID: Wendy Parker , female    DOB: 06/09/1966 , 58 y.o.   MRN: [id number]  Chief Complaint  Patient presents with   Diabetes    HPI  Patient presents today for a pre dm follow up, Patient reports compliance with medication. Patient denies any chest pain, SOB, or headaches. Patient reports having some discharge but "not coming out", when she wipes it is yellow sometimes. She feels like when she uses too much detergent in her clothes and have to double rinse her clothes. She will use dove unscented soap to wash her vaginal area. She is using white washcloths.   Patient reports she tried OTC medications and hasn't had relief. Patient reports she was referred to a OBGYN and they have called her for many appointment and she has been avoiding it.       Past Medical History:  Diagnosis Date   Allergy    Anxiety    Arthritis    Asthma    Carpal tunnel syndrome    Chronic kidney disease    Depression    Diabetes mellitus without complication (HCC)    GERD (gastroesophageal reflux disease)    Hepatitis C    HIV infection (HCC)    Sleep apnea      Family History  Problem Relation Age of Onset   Diabetes Mother    Diabetes Father    Cancer Father    Diabetes Sister      Current Outpatient Medications:    Accu-Chek Softclix Lancets lancets, 3 (three) times daily., Disp: , Rfl:    albuterol (VENTOLIN HFA) 108 (90 Base) MCG/ACT inhaler, Inhale 2 puffs into the lungs every 6 (six) hours as needed for wheezing or shortness of breath. As needed, Disp: 1 each, Rfl: 6   APPLE CIDER VINEGAR PO, Take by mouth., Disp: , Rfl:    atorvastatin (LIPITOR) 40 MG tablet, TAKE ONE TABLET BY MOUTH ONCE A DAY, Disp: 90 tablet, Rfl: 1   BLACK CURRANT SEED OIL PO,  Take by mouth., Disp: , Rfl:    Budesonide 90 MCG/ACT inhaler, Inhale 2 puffs into the lungs 2 (two) times daily., Disp: 1 each, Rfl: 1   fluticasone (FLONASE) 50 MCG/ACT nasal spray, Place 2 sprays into both nostrils daily., Disp: 16 g, Rfl: 2   metFORMIN (GLUCOPHAGE) 500 MG tablet, Take 1 tablet by mouth daily with evening meal, Disp: 90 tablet, Rfl: 1   Multiple Vitamin (MULTIVITAMIN ADULT PO), Multivitamin, Disp: , Rfl:    omeprazole (PRILOSEC) 40 MG capsule, Take 1 capsule (40 mg total) by mouth daily., Disp: 30 capsule, Rfl: 2   RYBELSUS 14 MG TABS, Take 1 tablet (14 mg total) by mouth daily., Disp: 30 tablet, Rfl: 2   TURMERIC PO, Take by mouth., Disp: , Rfl:    fluconazole (DIFLUCAN) 100 MG tablet, Take 1 tablet (100 mg total) by mouth daily. Take 1 tablet by mouth now repeat in 5 days, Disp: 2 tablet, Rfl: 0   Allergies  Allergen Reactions   Acetaminophen Itching and Other (See Comments)    swelling     Review of Systems  Constitutional: Negative.   HENT: Negative.    Eyes: Negative.   Respiratory: Negative.    Cardiovascular: Negative.   Gastrointestinal:  Positive  for constipation. Negative for vomiting.  Genitourinary:        Vaginal itching and vaginal odor   Neurological: Negative.   Psychiatric/Behavioral: Negative.       Today's Vitals   09/05/23 0849  BP: 110/70  Pulse: 85  Temp: 98.5 F (36.9 C)  TempSrc: Oral  Weight: (!) 310 lb 12.8 oz (141 kg)  Height: 5\' 7"  (1.702 m)  PainSc: 0-No pain   Body mass index is 48.68 kg/m.  Wt Readings from Last 3 Encounters:  09/05/23 (!) 310 lb 12.8 oz (141 kg)  06/05/23 (!) 305 lb 12.8 oz (138.7 kg)  02/15/23 289 lb 9.6 oz (131.4 kg)    Objective:  Physical Exam Vitals reviewed.  Constitutional:      General: She is not in acute distress.    Appearance: Normal appearance. She is well-developed. She is obese.  Eyes:     Pupils: Pupils are equal, round, and reactive to light.  Cardiovascular:     Rate and  Rhythm: Normal rate and regular rhythm.     Pulses: Normal pulses.     Heart sounds: Normal heart sounds. No murmur heard. Pulmonary:     Effort: Pulmonary effort is normal. No respiratory distress.     Breath sounds: Normal breath sounds. No wheezing.  Skin:    General: Skin is warm and dry.     Capillary Refill: Capillary refill takes less than 2 seconds.  Neurological:     General: No focal deficit present.     Mental Status: She is alert and oriented to person, place, and time.     Cranial Nerves: No cranial nerve deficit.     Motor: No weakness.  Psychiatric:        Mood and Affect: Mood normal.      Assessment And Plan:  Prediabetes Assessment & Plan: Chronic, continue current medications  Orders: -     Hemoglobin A1c  Vaginal discharge Assessment & Plan: I will treat with diflucan and send vaginal swab to check for yeast and bacterial vaginosis. I have advised to avoid washing her vaginal area with scented soap.  Orders: -     NuSwab Vaginitis Plus (VG+) -     Fluconazole; Take 1 tablet (100 mg total) by mouth daily. Take 1 tablet by mouth now repeat in 5 days  Dispense: 2 tablet; Refill: 0  Class 3 severe obesity due to excess calories with body mass index (BMI) of 45.0 to 49.9 in adult, unspecified whether serious comorbidity present [facility]) Assessment & Plan: She is encouraged to strive for BMI less than 30 to decrease cardiac risk. Advised to aim for at least 150 minutes of exercise per week. I have given her the number for healthy weight and wellness.   Orders: -     Amb Referral To Provider Referral Exercise Program (P.R.E.P)  Atherosclerosis of aorta (HCC) Assessment & Plan: Continue statin, tolerating well.    COVID-19 vaccination declined Assessment & Plan: Declines covid 19 vaccine. Discussed risk of covid 72 and if she changes her mind about the vaccine to call the office. Education has been provided regarding the importance of this vaccine but  patient still declined. Advised may receive this vaccine at local pharmacy or Health Dept.or vaccine clinic. Aware to provide a copy of the vaccination record if obtained from local pharmacy or Health Dept.  Encouraged to take multivitamin, vitamin d, vitamin c and zinc to increase immune system. Aware can call office if would like to have  vaccine here at office. Verbalized acceptance and understanding.    Immunization due Assessment & Plan: Prevnar 20 given in office.   Orders: -     Pneumococcal conjugate vaccine 20-valent   Return in about 6 months (around 03/04/2024) for pre DM .  Patient was given opportunity to ask questions. Patient verbalized understanding of the plan and was able to repeat key elements of the plan. All questions were answered to their satisfaction.    Jeanell Sparrow, FNP, have reviewed all documentation for this visit. The documentation on 09/05/23 for the exam, diagnosis, procedures, and orders are all accurate and complete.   IF YOU HAVE BEEN REFERRED TO A SPECIALIST, IT MAY TAKE 1-2 WEEKS TO SCHEDULE/PROCESS THE REFERRAL. IF YOU HAVE NOT HEARD FROM US/SPECIALIST IN TWO WEEKS, PLEASE GIVE [place] A CALL AT [phone].

## 2023-09-05 NOTE — Assessment & Plan Note (Signed)
Continue statin, tolerating well

## 2023-09-05 NOTE — Assessment & Plan Note (Signed)
I will treat with diflucan and send vaginal swab to check for yeast and bacterial vaginosis. I have advised to avoid washing her vaginal area with scented soap.

## 2023-09-05 NOTE — Assessment & Plan Note (Signed)

## 2023-09-05 NOTE — Assessment & Plan Note (Signed)
She is encouraged to strive for BMI less than 30 to decrease cardiac risk. Advised to aim for at least 150 minutes of exercise per week. I have given her the number for healthy weight and wellness.

## 2023-09-05 NOTE — Assessment & Plan Note (Signed)
Chronic, continue current medications

## 2023-09-05 NOTE — Assessment & Plan Note (Signed)
Prevnar 20 given in office.

## 2023-09-06 DIAGNOSIS — G4733 Obstructive sleep apnea (adult) (pediatric): Secondary | ICD-10-CM | POA: Diagnosis not present

## 2023-09-06 LAB — HEMOGLOBIN A1C
Est. average glucose Bld gHb Est-mCnc: 126 mg/dL
Hgb A1c MFr Bld: 6 % — ABNORMAL HIGH (ref 4.8–5.6)

## 2023-09-08 ENCOUNTER — Telehealth: Payer: Self-pay

## 2023-09-08 LAB — NUSWAB VAGINITIS PLUS (VG+)
Candida albicans, NAA: NEGATIVE
Candida glabrata, NAA: NEGATIVE
Chlamydia trachomatis, NAA: NEGATIVE
Neisseria gonorrhoeae, NAA: NEGATIVE
Trich vag by NAA: NEGATIVE

## 2023-09-08 NOTE — Telephone Encounter (Signed)
Called  re: PREP program referral, left voicemail requesting return call.

## 2023-09-08 NOTE — Telephone Encounter (Signed)
She returned my call, explained PREP, she would like to attend next class at [facility] on 2/25, every T/Th 10-11:15; assessment visit set up for 2/18 at 11am

## 2023-09-11 ENCOUNTER — Encounter: Payer: Self-pay | Admitting: Nurse Practitioner

## 2023-09-12 ENCOUNTER — Ambulatory Visit
Admission: RE | Admit: 2023-09-12 | Discharge: 2023-09-12 | Disposition: A | Discharge status: Home / Self Care | Payer: 59 | Source: Ambulatory Visit | Attending: Nurse Practitioner | Admitting: Nurse Practitioner

## 2023-09-12 DIAGNOSIS — K5900 Constipation, unspecified: Secondary | ICD-10-CM

## 2023-09-12 DIAGNOSIS — R109 Unspecified abdominal pain: Secondary | ICD-10-CM | POA: Diagnosis not present

## 2023-09-15 ENCOUNTER — Telehealth: Payer: Self-pay

## 2023-09-15 NOTE — Telephone Encounter (Signed)
 She called to cancel assessment visit for PREP, has decided she doesn't want to try to drive that far to [place] and cannot arrange transportation; asked her to contact me if that changes and she can work it out to attend.

## 2023-09-18 ENCOUNTER — Ambulatory Visit: Payer: Self-pay | Admitting: Nurse Practitioner

## 2023-09-25 ENCOUNTER — Ambulatory Visit
Admission: RE | Admit: 2023-09-25 | Discharge: 2023-09-25 | Disposition: A | Discharge status: Home / Self Care | Payer: 59 | Source: Ambulatory Visit | Attending: Nurse Practitioner | Admitting: Nurse Practitioner

## 2023-09-25 DIAGNOSIS — Z1231 Encounter for screening mammogram for malignant neoplasm of breast: Secondary | ICD-10-CM

## 2023-09-27 ENCOUNTER — Encounter: Payer: Self-pay | Admitting: Nurse Practitioner

## 2023-09-28 ENCOUNTER — Encounter: Payer: Self-pay | Admitting: Nurse Practitioner

## 2023-10-03 DIAGNOSIS — G8929 Other chronic pain: Secondary | ICD-10-CM | POA: Diagnosis not present

## 2023-10-03 DIAGNOSIS — M545 Low back pain, unspecified: Secondary | ICD-10-CM | POA: Diagnosis not present

## 2023-10-16 ENCOUNTER — Other Ambulatory Visit: Payer: Self-pay | Admitting: Nurse Practitioner

## 2023-10-16 NOTE — Telephone Encounter (Signed)
 Copied from CRM [id number]. Topic: Clinical - Medication Refill >> Oct 16, 2023  2:20 PM Geroge Baseman wrote: Most Recent Primary Care Visit:  Provider: Arnette Felts  Department: [facility]  Visit Type: OFFICE VISIT  Date: 09/05/2023  Medication: atorvastatin (LIPITOR) 40 MG tablet  Has the patient contacted their pharmacy? Yes   Is this the correct pharmacy for this prescription? Yes If no, delete pharmacy and type the correct one.  This is the patient's preferred pharmacy:  [id number] - [place], [place] - [address] AT [facility] [address] [place] [address] Phone: [phone] Fax: [phone]   Has the prescription been filled recently? No  Is the patient out of the medication? Yes, out all week  Has the patient been seen for an appointment in the last year OR does the patient have an upcoming appointment? Yes  Can we respond through MyChart? No  Agent: Please be advised that Rx refills may take up to 3 business days. We ask that you follow-up with your pharmacy.

## 2023-10-17 DIAGNOSIS — G8929 Other chronic pain: Secondary | ICD-10-CM | POA: Diagnosis not present

## 2023-10-17 DIAGNOSIS — M545 Low back pain, unspecified: Secondary | ICD-10-CM | POA: Diagnosis not present

## 2023-10-17 MED ORDER — ATORVASTATIN CALCIUM 40 MG PO TABS
40.0000 mg | ORAL_TABLET | Freq: Every day | ORAL | 1 refills | Status: DC
Start: 2023-10-17 — End: 2023-10-18

## 2023-10-18 ENCOUNTER — Other Ambulatory Visit: Payer: Self-pay

## 2023-10-18 MED ORDER — ATORVASTATIN CALCIUM 40 MG PO TABS: 40.0000 mg | ORAL_TABLET | Freq: Every day | ORAL | 1 refills | Status: AC

## 2023-10-31 DIAGNOSIS — M545 Low back pain, unspecified: Secondary | ICD-10-CM | POA: Diagnosis not present

## 2023-10-31 DIAGNOSIS — G8929 Other chronic pain: Secondary | ICD-10-CM | POA: Diagnosis not present

## 2023-11-02 DIAGNOSIS — L731 Pseudofolliculitis barbae: Secondary | ICD-10-CM | POA: Diagnosis not present

## 2023-11-02 DIAGNOSIS — L810 Postinflammatory hyperpigmentation: Secondary | ICD-10-CM | POA: Diagnosis not present

## 2023-11-04 ENCOUNTER — Other Ambulatory Visit: Payer: Self-pay | Admitting: Nurse Practitioner

## 2023-11-15 ENCOUNTER — Ambulatory Visit (INDEPENDENT_AMBULATORY_CARE_PROVIDER_SITE_OTHER): Payer: 59

## 2023-11-15 VITALS — BP 110/70 | HR 92 | Temp 98.3°F | Ht 67.0 in | Wt 307.4 lb

## 2023-11-15 DIAGNOSIS — Z0000 Encounter for general adult medical examination without abnormal findings: Secondary | ICD-10-CM | POA: Diagnosis not present

## 2023-11-15 NOTE — Patient Instructions (Signed)
 Ms. Legner , Thank you for taking time to come for your Medicare Wellness Visit. I appreciate your ongoing commitment to your health goals. Please review the following plan we discussed and let me know if I can assist you in the future.   Referrals/Orders/Follow-Ups/Clinician Recommendations: none  This is a list of the screening recommended for you and due dates:  Health Maintenance  Topic Date Due   Screening for Lung Cancer  11/18/2023   COVID-19 Vaccine (2 - 2024-25 season) 12/01/2023*   Zoster (Shingles) Vaccine (1 of 2) 02/14/2024*   Pap with HPV screening  06/04/2024*   Flu Shot  02/23/2024   Medicare Annual Wellness Visit  11/14/2024   Mammogram  09/24/2025   Colon Cancer Screening  09/15/2031   DTaP/Tdap/Td vaccine (2 - Td or Tdap) 11/14/2032   Pneumococcal Vaccination  Completed   Hepatitis C Screening  Completed   HIV Screening  Completed   HPV Vaccine  Aged Out   Meningitis B Vaccine  Aged Out  *Topic was postponed. The date shown is not the original due date.    Advanced directives: (Declined) Advance directive discussed with you today. Even though you declined this today, please call our office should you change your mind, and we can give you the proper paperwork for you to fill out.  Next Medicare Annual Wellness Visit scheduled for next year: Yes  insert Preventive Care attachment Insert FALL PREVENTION attachment if needed

## 2023-11-15 NOTE — Progress Notes (Signed)
 Subjective:   Wendy Parker is a 58 y.o. who presents for a Medicare Wellness preventive visit.  Visit Complete: In person    Persons Participating in Visit: Patient.  AWV Questionnaire: No: Patient Medicare AWV questionnaire was not completed prior to this visit.  Cardiac Risk Factors include: obesity (BMI >30kg/m2)     Objective:    Today's Vitals   11/15/23 1435  BP: 110/70  Pulse: 92  Temp: 98.3 F (36.8 C)  TempSrc: Oral  SpO2: 92%  Weight: (!) 307 lb 6.4 oz (139.4 kg)  Height: 5\' 7"  (1.702 m)   Body mass index is 48.15 kg/m.     11/15/2023    2:45 PM 11/09/2022    2:47 PM 07/23/2021    8:32 AM 12/27/2014    9:27 AM  Advanced Directives  Does Patient Have a Medical Advance Directive? No No No No  Would patient like information on creating a medical advance directive? No - Patient declined  No - Patient declined No - patient declined information    Current Medications (verified) Outpatient Encounter Medications as of 11/15/2023  Medication Sig   albuterol  (VENTOLIN  HFA) 108 (90 Base) MCG/ACT inhaler Inhale 2 puffs into the lungs every 6 (six) hours as needed for wheezing or shortness of breath. As needed   APPLE CIDER VINEGAR PO Take by mouth.   atorvastatin  (LIPITOR) 40 MG tablet Take 1 tablet (40 mg total) by mouth daily.   BLACK CURRANT SEED OIL PO Take by mouth.   fluticasone  (FLONASE ) 50 MCG/ACT nasal spray Place 2 sprays into both nostrils daily.   Multiple Vitamin (MULTIVITAMIN ADULT PO) Multivitamin   omeprazole  (PRILOSEC) 40 MG capsule TAKE 1 CAPSULE (40 MG TOTAL) BY MOUTH DAILY.   RYBELSUS  14 MG TABS TAKE 1 TABLET (14 MG TOTAL) BY MOUTH DAILY   TURMERIC PO Take by mouth.   Accu-Chek Softclix Lancets lancets 3 (three) times daily.   Budesonide  90 MCG/ACT inhaler Inhale 2 puffs into the lungs 2 (two) times daily. (Patient not taking: Reported on 11/15/2023)   fluconazole  (DIFLUCAN ) 100 MG tablet Take 1 tablet (100 mg total) by mouth daily. Take 1  tablet by mouth now repeat in 5 days (Patient not taking: Reported on 11/15/2023)   metFORMIN  (GLUCOPHAGE ) 500 MG tablet Take 1 tablet by mouth daily with evening meal (Patient not taking: Reported on 11/15/2023)   No facility-administered encounter medications on file as of 11/15/2023.    Allergies (verified) Acetaminophen    History: Past Medical History:  Diagnosis Date   Allergy    Anxiety    Arthritis    Asthma    Carpal tunnel syndrome    Chronic kidney disease    Depression    Diabetes mellitus without complication (HCC)    GERD (gastroesophageal reflux disease)    Hepatitis C    HIV infection (HCC)    Sleep apnea    Past Surgical History:  Procedure Laterality Date   ABDOMINAL HYSTERECTOMY     FOOT SURGERY     FRACTURE SURGERY     SMALL BOWEL ENTEROSCOPY     TONSILLECTOMY     Family History  Problem Relation Age of Onset   Diabetes Mother    Diabetes Father    Cancer Father    Diabetes Sister    Social History   Socioeconomic History   Marital status: Single    Spouse name: Not on file   Number of children: Not on file   Years of education: Not on  file   Highest education level: 11th grade  Occupational History   Not on file  Tobacco Use   Smoking status: Former    Current packs/day: 0.00    Types: Cigarettes    Start date: 07/25/1973    Quit date: 07/25/2020    Years since quitting: 3.3   Smokeless tobacco: Never  Vaping Use   Vaping status: Never Used  Substance and Sexual Activity   Alcohol use: Not Currently   Drug use: Not Currently    Types: Marijuana   Sexual activity: Not Currently    Partners: Female    Birth control/protection: None  Other Topics Concern   Not on file  Social History Narrative   Not on file   Social Drivers of Health   Financial Resource Strain: Low Risk  (11/15/2023)   Overall Financial Resource Strain (CARDIA)    Difficulty of Paying Living Expenses: Not hard at all  Food Insecurity: No Food Insecurity  (11/15/2023)   Hunger Vital Sign    Worried About Running Out of Food in the Last Year: Never true    Ran Out of Food in the Last Year: Never true  Transportation Needs: No Transportation Needs (11/15/2023)   PRAPARE - Administrator, Civil Service (Medical): No    Lack of Transportation (Non-Medical): No  Physical Activity: Inactive (11/15/2023)   Exercise Vital Sign    Days of Exercise per Week: 0 days    Minutes of Exercise per Session: 0 min  Stress: No Stress Concern Present (11/15/2023)   [employer] of Occupational Health - Occupational Stress Questionnaire    Feeling of Stress : Not at all  Social Connections: Moderately Isolated (11/15/2023)   Social Connection and Isolation Panel [NHANES]    Frequency of Communication with Friends and Family: Three times a week    Frequency of Social Gatherings with Friends and Family: Once a week    Attends Religious Services: More than 4 times per year    Active Member of [employer] or Organizations: No    Attends Engineer, structural: Never    Marital Status: Never married    Tobacco Counseling Counseling given: Not Answered    Clinical Intake:  Pre-visit preparation completed: Yes  Pain : No/denies pain     Nutritional Status: BMI > 30  Obese Nutritional Risks: None Diabetes: No  Lab Results  Component Value Date   HGBA1C 6.0 (H) 09/05/2023   HGBA1C 6.3 (H) 06/05/2023   HGBA1C 6.0 (H) 02/15/2023     How often do you need to have someone help you when you read instructions, pamphlets, or other written materials from your doctor or pharmacy?: 1 - Never  Interpreter Needed?: No  Information entered by :: NAllen LPN   Activities of Daily Living     11/15/2023    2:36 PM  In your present state of health, do you have any difficulty performing the following activities:  Hearing? 0  Vision? 0  Difficulty concentrating or making decisions? 0  Walking or climbing stairs? 1  Comment sometimes   Dressing or bathing? 0  Doing errands, shopping? 0  Preparing Food and eating ? N  Using the Toilet? N  In the past six months, have you accidently leaked urine? N  Do you have problems with loss of bowel control? N  Managing your Medications? N  Managing your Finances? N  Housekeeping or managing your Housekeeping? N    Patient Care Team: Susanna Epley, FNP as  PCP - General (General Practice)  Indicate any recent Medical Services you may have received from other than [facility] providers in the past year (date may be approximate).     Assessment:   This is a routine wellness examination for [place].  Hearing/Vision screen Hearing Screening - Comments:: Denies hearing issues Vision Screening - Comments:: Regular eye exams, [facility]   Goals Addressed             This Visit's Progress    Patient Stated       11/15/2023, wants to lose weight and see what right side pain is       Depression Screen     11/15/2023    2:46 PM 11/09/2022    2:48 PM 10/11/2022   11:10 AM  PHQ 2/9 Scores  PHQ - 2 Score 0 0 3  PHQ- 9 Score 1  5    Fall Risk     11/15/2023    2:45 PM 11/05/2022    8:56 AM 10/11/2022   11:09 AM  Fall Risk   Falls in the past year? 0 0 0  Number falls in past yr: 0 0   Injury with Fall? 0 0   Risk for fall due to : Medication side effect Medication side effect   Follow up Falls prevention discussed;Falls evaluation completed Falls prevention discussed;Education provided;Falls evaluation completed     MEDICARE RISK AT HOME:  Medicare Risk at Home Any stairs in or around the home?: Yes If so, are there any without handrails?: No Home free of loose throw rugs in walkways, pet beds, electrical cords, etc?: Yes Adequate lighting in your home to reduce risk of falls?: Yes Life alert?: No Use of a cane, walker or w/c?: No Grab bars in the bathroom?: No Shower chair or bench in shower?: No Elevated toilet seat or a handicapped toilet?: Yes  TIMED UP AND  GO:  Was the test performed?  Yes  Length of time to ambulate 10 feet: 5 sec Gait steady and fast without use of assistive device  Cognitive Function: 6CIT completed        11/15/2023    2:48 PM 11/09/2022    2:49 PM  6CIT Screen  What Year? 0 points 0 points  What month? 0 points 0 points  What time? 3 points 0 points  Count back from 20 0 points 0 points  Months in reverse 2 points 4 points  Repeat phrase 2 points 2 points  Total Score 7 points 6 points    Immunizations Immunization History  Administered Date(s) Administered   Influenza Inj Mdck Quad Pf 05/10/2021, 05/30/2022   Influenza, Seasonal, Injecte, Preservative Fre 06/05/2023   Influenza-Unspecified 05/10/2021   Janssen (J&J) SARS-COV-2 Vaccination 11/04/2019   PNEUMOCOCCAL CONJUGATE-20 09/05/2023   Pneumococcal Polysaccharide-23 09/14/2016   Tdap 11/15/2022    Screening Tests Health Maintenance  Topic Date Due   Lung Cancer Screening  11/18/2023   COVID-19 Vaccine (2 - 2024-25 season) 12/01/2023 (Originally 03/26/2023)   Zoster Vaccines- Shingrix (1 of 2) 02/14/2024 (Originally 08/02/2014)   Cervical Cancer Screening (HPV/Pap Cotest)  06/04/2024 (Originally 08/02/1994)   INFLUENZA VACCINE  02/23/2024   Medicare Annual Wellness (AWV)  11/14/2024   MAMMOGRAM  09/24/2025   Colonoscopy  09/15/2031   DTaP/Tdap/Td (2 - Td or Tdap) 11/14/2032   Pneumococcal Vaccine 14-16 Years old  Completed   Hepatitis C Screening  Completed   HIV Screening  Completed   HPV VACCINES  Aged Out  Meningococcal B Vaccine  Aged Out    Health Maintenance  Health Maintenance Due  Topic Date Due   Lung Cancer Screening  11/18/2023   Health Maintenance Items Addressed: Up to date  Additional Screening:  Vision Screening: Recommended annual ophthalmology exams for early detection of glaucoma and other disorders of the eye.  Dental Screening: Recommended annual dental exams for proper oral hygiene  Community Resource Referral  / Chronic Care Management: CRR required this visit?  No   CCM required this visit?  No     Plan:     I have personally reviewed and noted the following in the patient's chart:   Medical and social history Use of alcohol, tobacco or illicit drugs  Current medications and supplements including opioid prescriptions. Patient is not currently taking opioid prescriptions. Functional ability and status Nutritional status Physical activity Advanced directives List of other physicians Hospitalizations, surgeries, and ER visits in previous 12 months Vitals Screenings to include cognitive, depression, and falls Referrals and appointments  In addition, I have reviewed and discussed with patient certain preventive protocols, quality metrics, and best practice recommendations. A written personalized care plan for preventive services as well as general preventive health recommendations were provided to patient.     Areatha Beecham, LPN   2/53/6644   After Visit Summary: (In Person-Printed) AVS printed and given to the patient  Notes: Nothing significant to report at this time.

## 2023-11-28 ENCOUNTER — Other Ambulatory Visit: Payer: Self-pay | Admitting: Nurse Practitioner

## 2023-11-28 DIAGNOSIS — G8929 Other chronic pain: Secondary | ICD-10-CM | POA: Diagnosis not present

## 2023-11-28 DIAGNOSIS — M545 Low back pain, unspecified: Secondary | ICD-10-CM | POA: Diagnosis not present

## 2023-12-05 ENCOUNTER — Other Ambulatory Visit: Payer: Self-pay | Admitting: Nurse Practitioner

## 2023-12-05 DIAGNOSIS — N898 Other specified noninflammatory disorders of vagina: Secondary | ICD-10-CM

## 2023-12-05 MED ORDER — FLUCONAZOLE 100 MG PO TABS: 100.0000 mg | ORAL_TABLET | Freq: Every day | ORAL | 0 refills | Status: AC

## 2023-12-05 NOTE — Telephone Encounter (Signed)
 Copied from CRM [id number]. Topic: Clinical - Medication Refill >> Dec 05, 2023  2:18 PM Rosaria Common wrote: Medication: fluconazole  (DIFLUCAN ) 100 MG tablet  Has the patient contacted their pharmacy? Yes (Agent: If no, request that the patient contact the pharmacy for the refill. If patient does not wish to contact the pharmacy document the reason why and proceed with request.) (Agent: If yes, when and what did the pharmacy advise?)  This is the patient's preferred pharmacy:  [id number] - [place], [place] - [address] AT [facility] [address] [place] [address] Phone: [phone] Fax: [phone]  Is this the correct pharmacy for this prescription? Yes If no, delete pharmacy and type the correct one.   Has the prescription been filled recently? No  Is the patient out of the medication? Yes  Has the patient been seen for an appointment in the last year OR does the patient have an upcoming appointment? Yes  Can we respond through MyChart? Yes  Agent: Please be advised that Rx refills may take up to 3 business days. We ask that you follow-up with your pharmacy.

## 2023-12-12 ENCOUNTER — Other Ambulatory Visit: Payer: Self-pay

## 2023-12-12 MED ORDER — RYBELSUS 14 MG PO TABS: 1.0000 | ORAL_TABLET | Freq: Every day | ORAL | 2 refills | Status: DC

## 2023-12-14 ENCOUNTER — Other Ambulatory Visit: Payer: Self-pay

## 2023-12-14 MED ORDER — RYBELSUS 14 MG PO TABS: 1.0000 | ORAL_TABLET | Freq: Every day | ORAL | 2 refills | Status: DC

## 2023-12-26 DIAGNOSIS — M545 Low back pain, unspecified: Secondary | ICD-10-CM | POA: Diagnosis not present

## 2023-12-26 DIAGNOSIS — G8929 Other chronic pain: Secondary | ICD-10-CM | POA: Diagnosis not present

## 2024-01-09 DIAGNOSIS — M545 Low back pain, unspecified: Secondary | ICD-10-CM | POA: Diagnosis not present

## 2024-01-09 DIAGNOSIS — G8929 Other chronic pain: Secondary | ICD-10-CM | POA: Diagnosis not present

## 2024-01-29 ENCOUNTER — Other Ambulatory Visit: Payer: Self-pay | Admitting: Nurse Practitioner

## 2024-01-29 DIAGNOSIS — N898 Other specified noninflammatory disorders of vagina: Secondary | ICD-10-CM

## 2024-01-29 NOTE — Telephone Encounter (Signed)
 Copied from CRM [id number]. Topic: Clinical - Medication Refill >> Jan 29, 2024  4:16 PM Selinda RAMAN wrote: Medication: fluconazole  (DIFLUCAN ) 100 MG tablet  Has the patient contacted their pharmacy? No   This is the patient's preferred pharmacy:  [id number] - [place], [place] - [address] AT [facility] [address] [place] [address] Phone: [phone] Fax: [phone]  Is this the correct pharmacy for this prescription? Yes If no, delete pharmacy and type the correct one.   Has the prescription been filled recently? No  Is the patient out of the medication? Yes  Has the patient been seen for an appointment in the last year OR does the patient have an upcoming appointment? Yes  Can we respond through MyChart? Yes  Please assist patient further as she knows what is going on and has declined to be triaged. She said this medicine is the only thing that helps with this issue.

## 2024-01-31 ENCOUNTER — Telehealth: Payer: Self-pay

## 2024-01-31 ENCOUNTER — Other Ambulatory Visit: Payer: Self-pay | Admitting: Nurse Practitioner

## 2024-01-31 DIAGNOSIS — N898 Other specified noninflammatory disorders of vagina: Secondary | ICD-10-CM

## 2024-01-31 NOTE — Telephone Encounter (Signed)
 Copied from CRM [id number]. Topic: Clinical - Medication Refill >> Jan 29, 2024  4:16 PM Selinda RAMAN wrote: Medication: fluconazole  (DIFLUCAN ) 100 MG tablet  Has the patient contacted their pharmacy? No   This is the patient's preferred pharmacy:  [id number] - [place], [place] - [address] AT [facility] [address] [place] [address] Phone: [phone] Fax: [phone]  Is this the correct pharmacy for this prescription? Yes If no, delete pharmacy and type the correct one.   Has the prescription been filled recently? No  Is the patient out of the medication? Yes  Has the patient been seen for an appointment in the last year OR does the patient have an upcoming appointment? Yes  Can we respond through MyChart? Yes  Please assist patient further as she knows what is going on and has declined to be triaged. She said this medicine is the only thing that helps with this issue.   Spoke with patient and informed her she would need an appointment to be evaluated before send in fluconazole , she stated she will just see us  in August because she doesn't have time for an appointment. Patient was advised to go to urgent care if that schedule is better.

## 2024-02-02 ENCOUNTER — Other Ambulatory Visit: Payer: Self-pay | Admitting: Nurse Practitioner

## 2024-02-03 ENCOUNTER — Other Ambulatory Visit: Payer: Self-pay | Admitting: Primary Care

## 2024-02-07 ENCOUNTER — Encounter: Payer: Self-pay | Admitting: Nurse Practitioner

## 2024-02-07 ENCOUNTER — Ambulatory Visit (INDEPENDENT_AMBULATORY_CARE_PROVIDER_SITE_OTHER): Payer: Self-pay | Admitting: Nurse Practitioner

## 2024-02-07 VITALS — BP 110/60 | HR 74 | Temp 98.9°F | Ht 67.0 in | Wt 310.2 lb

## 2024-02-07 DIAGNOSIS — Z2821 Immunization not carried out because of patient refusal: Secondary | ICD-10-CM | POA: Diagnosis not present

## 2024-02-07 DIAGNOSIS — R7303 Prediabetes: Secondary | ICD-10-CM | POA: Diagnosis not present

## 2024-02-07 DIAGNOSIS — N898 Other specified noninflammatory disorders of vagina: Secondary | ICD-10-CM

## 2024-02-07 DIAGNOSIS — Z139 Encounter for screening, unspecified: Secondary | ICD-10-CM

## 2024-02-07 DIAGNOSIS — E66813 Obesity, class 3: Secondary | ICD-10-CM

## 2024-02-07 DIAGNOSIS — Z6841 Body Mass Index (BMI) 40.0 and over, adult: Secondary | ICD-10-CM

## 2024-02-07 DIAGNOSIS — K5900 Constipation, unspecified: Secondary | ICD-10-CM | POA: Diagnosis not present

## 2024-02-07 DIAGNOSIS — Z87891 Personal history of nicotine dependence: Secondary | ICD-10-CM | POA: Diagnosis not present

## 2024-02-07 DIAGNOSIS — I700 Atherosclerosis of aorta: Secondary | ICD-10-CM

## 2024-02-07 DIAGNOSIS — R140 Abdominal distension (gaseous): Secondary | ICD-10-CM | POA: Diagnosis not present

## 2024-02-07 DIAGNOSIS — E669 Obesity, unspecified: Secondary | ICD-10-CM | POA: Insufficient documentation

## 2024-02-07 NOTE — Progress Notes (Signed)
 LILLETTE Kristeen JINNY Gladis, CMA,acting as a Neurosurgeon for Gaines Ada, FNP.,have documented all relevant documentation on the behalf of Gaines Ada, FNP,as directed by  Gaines Ada, FNP while in the presence of Gaines Ada, FNP.  Subjective:  Patient ID: Wendy Parker , female    DOB: Dec 08, 1965 , 58 y.o.   MRN: [id number]  Chief Complaint  Patient presents with   [facility]    Patient presents today for bloating and a little itching. She reports she thinks she has a yeast infection. She reports she hasn't had any other symptoms.  Patient reports she had a surgery in the 90s and the surgery scar still gets tender.     HPI Discussed the use of AI scribe software for clinical note transcription with the patient, who gave verbal consent to proceed.  History of Present Illness Wendy Parker is a 58 year old female who presents for a follow-up visit regarding her prediabetes.  She experiences ongoing bloating, which she describes as typical for her. She has not consulted a gastroenterologist recently, although she was referred to one in 2022 or 2023. She recalls a past bowel blockage and her last bowel movement was on Monday, which was abnormal due to something she ate at a restaurant. No nausea, vomiting, or abdominal pain, but she mentions muscle pulls in her back related to swelling.  She has a history of chronic constipation and was previously on Linzess, which was effective, and oxycodone . Prior diagnostic studies include a rib quadriceps ultrasound and a hiatus scan.  In terms of her social history, she is a former smoker, having quit more than fifteen years ago. She has recently started eating bread again after previously avoiding it and opting for wraps instead.   Past Medical History:  Diagnosis Date   Allergy    Anxiety    Arthritis    Asthma    Carpal tunnel syndrome    Chronic kidney disease    Depression    Diabetes mellitus without complication (HCC)    GERD (gastroesophageal reflux  disease)    Hepatitis C    HIV infection (HCC)    Sleep apnea      Family History  Problem Relation Age of Onset   Diabetes Mother    Diabetes Father    Cancer Father    Diabetes Sister      Current Outpatient Medications:    Accu-Chek Softclix Lancets lancets, 3 (three) times daily., Disp: , Rfl:    albuterol  (VENTOLIN  HFA) 108 (90 Base) MCG/ACT inhaler, Inhale 2 puffs into the lungs every 6 (six) hours as needed for wheezing or shortness of breath. As needed, Disp: 1 each, Rfl: 6   APPLE CIDER VINEGAR PO, Take by mouth., Disp: , Rfl:    atorvastatin  (LIPITOR) 40 MG tablet, Take 1 tablet (40 mg total) by mouth daily., Disp: 90 tablet, Rfl: 1   BLACK CURRANT SEED OIL PO, Take by mouth., Disp: , Rfl:    fluconazole  (DIFLUCAN ) 100 MG tablet, Take 1 tablet (100 mg total) by mouth daily. Take 1 tablet by mouth now repeat in 5 days, Disp: 2 tablet, Rfl: 0   Multiple Vitamin (MULTIVITAMIN ADULT PO), Multivitamin, Disp: , Rfl:    omeprazole  (PRILOSEC) 40 MG capsule, TAKE 1 CAPSULE (40 MG TOTAL) BY MOUTH DAILY., Disp: 30 capsule, Rfl: 2   RYBELSUS  14 MG TABS, Take 1 tablet (14 mg total) by mouth daily., Disp: 30 tablet, Rfl: 2   TURMERIC PO, Take by mouth., Disp: ,  Rfl:    Allergies  Allergen Reactions   Acetaminophen  Itching and Other (See Comments)    swelling     Review of Systems  Constitutional: Negative.   HENT: Negative.    Eyes: Negative.   Respiratory: Negative.    Cardiovascular: Negative.   Gastrointestinal:  Positive for abdominal distention and constipation. Negative for abdominal pain, diarrhea, nausea and vomiting.  Genitourinary:        Vaginal itching and vaginal odor   Neurological: Negative.   Psychiatric/Behavioral: Negative.       Today's Vitals   02/07/24 0934  BP: 110/60  Pulse: 74  Temp: 98.9 F (37.2 C)  TempSrc: Oral  Weight: (!) 310 lb 3.2 oz (140.7 kg)  Height: 5' 7 (1.702 m)  PainSc: 0-No pain   Body mass index is 48.58 kg/m.  Wt  Readings from Last 3 Encounters:  02/07/24 (!) 310 lb 3.2 oz (140.7 kg)  11/15/23 (!) 307 lb 6.4 oz (139.4 kg)  09/05/23 (!) 310 lb 12.8 oz (141 kg)    Objective:  Physical Exam Vitals reviewed.  Constitutional:      General: She is not in acute distress.    Appearance: Normal appearance. She is obese.  Cardiovascular:     Rate and Rhythm: Normal rate and regular rhythm.     Pulses: Normal pulses.     Heart sounds: Normal heart sounds. No murmur heard. Pulmonary:     Effort: Pulmonary effort is normal. No respiratory distress.     Breath sounds: Normal breath sounds. No wheezing.  Neurological:     Mental Status: She is alert.         Assessment And Plan:  Prediabetes Assessment & Plan: Chronic, continue current medications  Orders: -     Hemoglobin A1c -     CMP14+EGFR  Vaginal itching Assessment & Plan: Will check for yeast infection  Orders: -     NuSwab Vaginitis Plus (VG+)  Bloating Assessment & Plan: Chronic bloating with constipation history. Linzess effective previously. Avoids invasive procedures. - Provide probiotic samples. - Encourage dietary modifications, reduce bread and pasta. - Refer to gastroenterology.  Orders: -     CMP14+EGFR  COVID-19 vaccination declined Assessment & Plan: Declines covid 19 vaccine. Discussed risk of covid 75 and if she changes her mind about the vaccine to call the office. Education has been provided regarding the importance of this vaccine but patient still declined. Advised may receive this vaccine at local pharmacy or Health Dept.or vaccine clinic. Aware to provide a copy of the vaccination record if obtained from local pharmacy or Health Dept.  Encouraged to take multivitamin, vitamin d, vitamin c and zinc to increase immune system. Aware can call office if would like to have vaccine here at office. Verbalized acceptance and understanding.    Class 3 severe obesity due to excess calories with body mass index (BMI) of  45.0 to 49.9 in adult, unspecified whether serious comorbidity present Assessment & Plan: She is encouraged to strive for BMI less than 30 to decrease cardiac risk. Advised to aim for at least 150 minutes of exercise per week.    Encounter for screening -     Hepatitis B surface antibody,qualitative  Atherosclerosis of aorta (HCC) Assessment & Plan: Continue statin, tolerating well.    History of tobacco abuse -     CT CHEST LUNG CANCER SCREENING LOW DOSE WO CONTRAST; Future  Constipation, unspecified constipation type Assessment & Plan: Persistent symptoms without recent gastroenterology follow-up. -  Refer to gastroenterology. - Advise follow-up with Delon Sands, PA.     Return in about 6 months (around 08/09/2024) for pre dm; HM.  Patient was given opportunity to ask questions. Patient verbalized understanding of the plan and was able to repeat key elements of the plan. All questions were answered to their satisfaction.     LILLETTE Gaines Ada, FNP, have reviewed all documentation for this visit. The documentation on 02/07/24 for the exam, diagnosis, procedures, and orders are all accurate and complete.  IF YOU HAVE BEEN REFERRED TO A SPECIALIST, IT MAY TAKE 1-2 WEEKS TO SCHEDULE/PROCESS THE REFERRAL. IF YOU HAVE NOT HEARD FROM US /SPECIALIST IN TWO WEEKS, PLEASE GIVE US  A CALL AT [phone].

## 2024-02-07 NOTE — Patient Instructions (Addendum)
 You can call [facility] for an appt, you seen Delon Failing PA

## 2024-02-08 ENCOUNTER — Ambulatory Visit: Payer: Self-pay | Admitting: Nurse Practitioner

## 2024-02-08 LAB — CMP14+EGFR
ALT: 15 IU/L (ref 0–32)
AST: 17 IU/L (ref 0–40)
Albumin: 4.4 g/dL (ref 3.8–4.9)
Alkaline Phosphatase: 100 IU/L (ref 44–121)
BUN/Creatinine Ratio: 15 (ref 9–23)
BUN: 12 mg/dL (ref 6–24)
Bilirubin Total: 0.2 mg/dL (ref 0.0–1.2)
CO2: 24 mmol/L (ref 20–29)
Calcium: 9.9 mg/dL (ref 8.7–10.2)
Chloride: 101 mmol/L (ref 96–106)
Creatinine, Ser: 0.81 mg/dL (ref 0.57–1.00)
Globulin, Total: 2.7 g/dL (ref 1.5–4.5)
Glucose: 88 mg/dL (ref 70–99)
Potassium: 4.4 mmol/L (ref 3.5–5.2)
Sodium: 140 mmol/L (ref 134–144)
Total Protein: 7.1 g/dL (ref 6.0–8.5)
eGFR: 84 mL/min/1.73 (ref >59)

## 2024-02-08 LAB — HEMOGLOBIN A1C
Est. average glucose Bld gHb Est-mCnc: 128 mg/dL
Hgb A1c MFr Bld: 6.1 % — ABNORMAL HIGH (ref 4.8–5.6)

## 2024-02-08 LAB — HEPATITIS B SURFACE ANTIBODY,QUALITATIVE: Hep B Surface Ab, Qual: NONREACTIVE

## 2024-02-09 LAB — NUSWAB VAGINITIS PLUS (VG+)
Candida albicans, NAA: NEGATIVE
Candida glabrata, NAA: NEGATIVE
Chlamydia trachomatis, NAA: NEGATIVE
Neisseria gonorrhoeae, NAA: NEGATIVE
Trich vag by NAA: NEGATIVE

## 2024-02-13 ENCOUNTER — Other Ambulatory Visit: Payer: Self-pay | Admitting: Nurse Practitioner

## 2024-02-13 DIAGNOSIS — N898 Other specified noninflammatory disorders of vagina: Secondary | ICD-10-CM

## 2024-02-13 NOTE — Telephone Encounter (Unsigned)
 Copied from CRM [id number]. Topic: Clinical - Medication Refill >> Feb 13, 2024 12:48 PM Paige D wrote: Medication: fluconazole  (DIFLUCAN ) 100 MG tablet  Has the patient contacted their pharmacy? No (Agent: If no, request that the patient contact the pharmacy for the refill. If patient does not wish to contact the pharmacy document the reason why and proceed with request.) (Agent: If yes, when and what did the pharmacy advise?)  This is the patient's preferred pharmacy:   [id number] - [place], [place] - [address] AT [facility] [address] [place] [address] Phone: [phone] Fax: [phone]  Is this the correct pharmacy for this prescription? Yes If no, delete pharmacy and type the correct one.   Has the prescription been filled recently? Yes a month or so   Is the patient out of the medication? Yes  Has the patient been seen for an appointment in the last year OR does the patient have an upcoming appointment? Yes  Can we respond through MyChart? Prefers a call   Agent: Please be advised that Rx refills may take up to 3 business days. We ask that you follow-up with your pharmacy.

## 2024-02-13 NOTE — Telephone Encounter (Signed)
 Last OV 02/07/2024

## 2024-02-15 ENCOUNTER — Telehealth: Payer: Self-pay

## 2024-02-15 DIAGNOSIS — K5900 Constipation, unspecified: Secondary | ICD-10-CM | POA: Insufficient documentation

## 2024-02-15 NOTE — Assessment & Plan Note (Signed)
 Chronic, continue current medications

## 2024-02-15 NOTE — Assessment & Plan Note (Signed)
 Persistent symptoms without recent gastroenterology follow-up. - Refer to gastroenterology. - Advise follow-up with Delon Sands, PA.

## 2024-02-15 NOTE — Assessment & Plan Note (Signed)

## 2024-02-15 NOTE — Telephone Encounter (Signed)
 Copied from CRM [id number]. Topic: Clinical - Medication Question >> Feb 15, 2024  9:32 AM Wendy Parker wrote: Reason for CRM: Patient called in following up on refill request for fluconazole  (DIFLUCAN ) 100 MG tablet advised it was refused. Reached out to CAL for more info on refusal stated both pcp and nurse was currently with a patient. Please call [phone] to explain further  Wendy Parker, CMA- patient called and advised that she didn't need any diflucan  because her swab was negative for yeast and BV. Patient notified and verbalized understanding.

## 2024-02-15 NOTE — Assessment & Plan Note (Signed)
 She is encouraged to strive for BMI less than 30 to decrease cardiac risk. Advised to aim for at least 150 minutes of exercise per week.

## 2024-02-15 NOTE — Assessment & Plan Note (Signed)
 Chronic bloating with constipation history. Linzess effective previously. Avoids invasive procedures. - Provide probiotic samples. - Encourage dietary modifications, reduce bread and pasta. - Refer to gastroenterology.

## 2024-02-15 NOTE — Assessment & Plan Note (Signed)
 Will check for yeast infection

## 2024-02-15 NOTE — Assessment & Plan Note (Signed)
 Continue statin, tolerating well

## 2024-02-16 ENCOUNTER — Ambulatory Visit
Admission: RE | Admit: 2024-02-16 | Discharge: 2024-02-16 | Disposition: A | Discharge status: Home / Self Care | Source: Ambulatory Visit | Attending: Nurse Practitioner | Admitting: Nurse Practitioner

## 2024-02-16 DIAGNOSIS — Z122 Encounter for screening for malignant neoplasm of respiratory organs: Secondary | ICD-10-CM | POA: Diagnosis not present

## 2024-02-16 DIAGNOSIS — Z87891 Personal history of nicotine dependence: Secondary | ICD-10-CM | POA: Diagnosis not present

## 2024-03-04 ENCOUNTER — Ambulatory Visit: Payer: 59 | Admitting: Nurse Practitioner

## 2024-04-29 ENCOUNTER — Telehealth: Payer: Self-pay

## 2024-04-29 NOTE — Telephone Encounter (Signed)
 Copied from CRM #[id number]. Topic: Clinical - Medical Advice >> Apr 29, 2024 10:22 AM Darshell M wrote: Reason for CRM: Patient has a letter from provider approving support animal. She needs to have the letter updated to reflect that she can have two dogs. Current support is not well and not expected to live much longer. K-9 and service for emotional support animals needs. Patient call back # [phone]

## 2024-05-04 ENCOUNTER — Other Ambulatory Visit: Payer: Self-pay | Admitting: Nurse Practitioner

## 2024-06-04 ENCOUNTER — Telehealth: Payer: Self-pay | Admitting: Nurse Practitioner

## 2024-06-04 NOTE — Telephone Encounter (Signed)
 Called patient to discuss the recent labs she got from her [facility] in reference to myself not being her primary care provider which is currently in air from [employer] [facility] as I am still in their network.  She informed me that she has already been to a new primary care provider yesterday due to having difficulty breathing and exposure to mold.  They have already done labs and she would like to remain with them at [facility] with Daphne Lesches.  We will remove myself from her primary care provider and change to Leotis Lesches advise she may not be able to come back to the office in the future.  I also explained her results to her CT lung scan and reassured her of the findings.  She was apologetic over the most recent message that she sent over, she was confused about what was going on with the primary care situation.

## 2024-07-24 ENCOUNTER — Other Ambulatory Visit: Payer: Self-pay | Admitting: Nurse Practitioner

## 2024-08-13 NOTE — Progress Notes (Unsigned)
 "  New Patient Note  RE: Wendy Parker MRN: [id number] DOB: Sep 30, 1965 Date of Office Visit: 08/14/2024  Consult requested by: Arloa Jarvis, NP Primary care provider: Georgina Speaks, FNP  Chief Complaint: No chief complaint on file.  History of Present Illness: I had the pleasure of seeing Wendy Parker for [facility]. She is a 59 y.o. female, who is referred here by Arloa Jarvis, NP for the evaluation of mold exposure.  Discussed the use of AI scribe software for clinical note transcription with the patient, who gave verbal consent to proceed.  History of Present Illness             ***  Assessment and Plan: [name] is a 59 y.o. female with: ***  Assessment and Plan               No follow-ups on file.  No orders of the defined types were placed in this encounter.  Lab Orders  No laboratory test(s) ordered today    Other allergy screening: Asthma: {Blank single:19197::yes,no} Rhino conjunctivitis: {Blank single:19197::yes,no} Food allergy: {Blank single:19197::yes,no} Medication allergy: {Blank single:19197::yes,no} Hymenoptera allergy: {Blank single:19197::yes,no} Urticaria: {Blank single:19197::yes,no} Eczema:{Blank single:19197::yes,no} History of recurrent infections suggestive of immunodeficency: {Blank single:19197::yes,no}  Diagnostics: Spirometry:  Tracings reviewed. Her effort: {Blank single:19197::Good reproducible efforts.,It was hard to get consistent efforts and there is a question as to whether this reflects a maximal maneuver.,Poor effort, data can not be interpreted.} FVC: ***L FEV1: ***L, ***% predicted FEV1/FVC ratio: ***% Interpretation: {Blank single:19197::Spirometry consistent with mild obstructive disease,Spirometry consistent with moderate obstructive disease,Spirometry consistent with severe obstructive disease,Spirometry  consistent with possible restrictive disease,Spirometry consistent with mixed obstructive and restrictive disease,Spirometry uninterpretable due to technique,Spirometry consistent with normal pattern,No overt abnormalities noted given today's efforts}.  Please see scanned spirometry results for details.  Skin Testing: {Blank single:19197::Select foods,Environmental allergy panel,Environmental allergy panel and select foods,Food allergy panel,None,Deferred due to recent antihistamines use}. *** Results discussed with patient/family.   Past Medical History: Patient Active Problem List   Diagnosis Date Noted   Constipation 02/15/2024   Bloating 02/07/2024   Obesity, unspecified 02/07/2024   Vaginal discharge 09/05/2023   Immunization due 09/05/2023   Vaginal itching 06/13/2023   Vaginal odor 06/13/2023   Class 3 severe obesity due to excess calories with body mass index (BMI) of 45.0 to 49.9 in adult [facility]) 06/13/2023   Need for influenza vaccination 06/13/2023   Chronic pain of right ankle 06/13/2023   Prediabetes 02/15/2023   Herpes zoster vaccination declined 02/15/2023   COVID-19 vaccination declined 02/15/2023   Atherosclerosis of aorta 02/15/2023   Wheezing 08/05/2022   OSA (obstructive sleep apnea) 12/01/2021   Daytime sleepiness 12/01/2021   Morbid obesity with BMI of 45.0-49.9, adult (HCC) 12/01/2021   Mild persistent asthma without complication 03/02/2020   Hepatitis C antibody test positive 12/25/2017   Bilateral carpal tunnel syndrome 08/15/2017   Laryngopharyngeal reflux (LPR) 01/20/2016   Chronic back pain 03/07/2013   Past Medical History:  Diagnosis Date   Allergy    Anxiety    Arthritis    Asthma    Carpal tunnel syndrome    Chronic kidney disease    Depression    Diabetes mellitus without complication (HCC)    GERD (gastroesophageal reflux disease)    Hepatitis C    HIV infection (HCC)    Sleep apnea     Past Surgical History: Past Surgical History:  Procedure Laterality Date   ABDOMINAL  HYSTERECTOMY     FOOT SURGERY     FRACTURE SURGERY     SMALL BOWEL ENTEROSCOPY     TONSILLECTOMY     Medication List:  Current Outpatient Medications  Medication Sig Dispense Refill   Accu-Chek Softclix Lancets lancets 3 (three) times daily.     albuterol  (VENTOLIN  HFA) 108 (90 Base) MCG/ACT inhaler Inhale 2 puffs into the lungs every 6 (six) hours as needed for wheezing or shortness of breath. As needed 1 each 6   APPLE CIDER VINEGAR PO Take by mouth.     atorvastatin  (LIPITOR) 40 MG tablet Take 1 tablet (40 mg total) by mouth daily. 90 tablet 1   BLACK CURRANT SEED OIL PO Take by mouth.     fluconazole  (DIFLUCAN ) 100 MG tablet Take 1 tablet (100 mg total) by mouth daily. Take 1 tablet by mouth now repeat in 5 days 2 tablet 0   Multiple Vitamin (MULTIVITAMIN ADULT PO) Multivitamin     omeprazole  (PRILOSEC) 40 MG capsule TAKE 1 CAPSULE (40 MG TOTAL) BY MOUTH DAILY. 30 capsule 2   RYBELSUS  14 MG TABS TAKE 1 TABLET (14 MG TOTAL) BY MOUTH DAILY 30 tablet 0   TURMERIC PO Take by mouth.     No current facility-administered medications for this visit.   Allergies: Allergies[1] Social History: Social History   Socioeconomic History   Marital status: Single    Spouse name: Not on file   Number of children: Not on file   Years of education: Not on file   Highest education level: 11th grade  Occupational History   Not on file  Tobacco Use   Smoking status: Former    Current packs/day: 0.00    Types: Cigarettes    Start date: 07/25/1973    Quit date: 07/25/2020    Years since quitting: 4.0   Smokeless tobacco: Never  Vaping Use   Vaping status: Never Used  Substance and Sexual Activity   Alcohol use: Not Currently   Drug use: Not Currently    Types: Marijuana   Sexual activity: Not Currently    Partners: Female    Birth control/protection: None  Other Topics  Concern   Not on file  Social History Narrative   Not on file   Social Drivers of Health   Tobacco Use: Medium Risk (02/07/2024)   Patient History    Smoking Tobacco Use: Former    Smokeless Tobacco Use: Never    Passive Exposure: Not on file  Financial Resource Strain: Patient Declined (02/06/2024)   Overall Financial Resource Strain (CARDIA)    Difficulty of Paying Living Expenses: Patient declined  Food Insecurity: No Food Insecurity (02/06/2024)   Epic    Worried About Programme Researcher, Broadcasting/film/video in the Last Year: Never true    Ran Out of Food in the Last Year: Never true  Transportation Needs: No Transportation Needs (02/06/2024)   Epic    Lack of Transportation (Medical): No    Lack of Transportation (Non-Medical): No  Physical Activity: Sufficiently Active (02/06/2024)   Exercise Vital Sign    Days of Exercise per Week: 3 days    Minutes of Exercise per Session: 60 min  Recent Concern: Physical Activity - Inactive (11/15/2023)   Exercise Vital Sign    Days of Exercise per Week: 0 days    Minutes of Exercise per Session: 0 min  Stress: No Stress Concern Present (02/06/2024)   [employer] of Occupational Health - Occupational Stress Questionnaire    Feeling  of Stress: Not at all  Social Connections: Unknown (02/06/2024)   Social Connection and Isolation Panel    Frequency of Communication with Friends and Family: More than three times a week    Frequency of Social Gatherings with Friends and Family: More than three times a week    Attends Religious Services: Patient declined    Database Administrator or Organizations: Patient declined    Attends Banker Meetings: Not on file    Marital Status: Patient declined  Recent Concern: Social Connections - Moderately Isolated (11/15/2023)   Social Connection and Isolation Panel    Frequency of Communication with Friends and Family: Three times a week    Frequency of Social Gatherings with Friends and  Family: Once a week    Attends Religious Services: More than 4 times per year    Active Member of Clubs or Organizations: No    Attends Banker Meetings: Never    Marital Status: Never married  Depression (PHQ2-9): Low Risk (11/15/2023)   Depression (PHQ2-9)    PHQ-2 Score: 1  Alcohol Screen: Low Risk (11/15/2023)   Alcohol Screen    Last Alcohol Screening Score (AUDIT): 0  Housing: Unknown (02/06/2024)   Epic    Unable to Pay for Housing in the Last Year: No    Number of Times Moved in the Last Year: Not on file    Homeless in the Last Year: No  Utilities: Not At Risk (11/15/2023)   [employer] Utilities    Threatened with loss of utilities: No  Health Literacy: Adequate Health Literacy (11/15/2023)   B1300 Health Literacy    Frequency of need for help with medical instructions: Never   Lives in a ***. Smoking: *** Occupation: ***  Environmental HistorySurveyor, Minerals in the house: Network Engineer in the family room: {Blank single:19197::yes,no} Carpet in the bedroom: {Blank single:19197::yes,no} Heating: {Blank single:19197::electric,gas,heat pump} Cooling: {Blank single:19197::central,window,heat pump} Pet: {Blank single:19197::yes ***,no}  Family History: Family History  Problem Relation Age of Onset   Diabetes Mother    Diabetes Father    Cancer Father    Diabetes Sister    Problem                               Relation Asthma                                   *** Eczema                                *** Food allergy                          *** Allergic rhino conjunctivitis     ***  Review of Systems  Constitutional:  Negative for appetite change, chills, fever and unexpected weight change.  HENT:  Negative for congestion and rhinorrhea.   Eyes:  Negative for itching.  Respiratory:  Negative for cough, chest tightness, shortness of breath and wheezing.   Cardiovascular:  Negative for  chest pain.  Gastrointestinal:  Negative for abdominal pain.  Genitourinary:  Negative for difficulty urinating.  Skin:  Negative for rash.  Neurological:  Negative for headaches.    Objective: There were no vitals taken for this visit. There is  no height or weight on file to calculate BMI. Physical Exam Vitals and nursing note reviewed.  Constitutional:      Appearance: Normal appearance. She is well-developed.  HENT:     Head: Normocephalic and atraumatic.     Right Ear: Tympanic membrane and external ear normal.     Left Ear: Tympanic membrane and external ear normal.     Nose: Nose normal.     Mouth/Throat:     Mouth: Mucous membranes are moist.     Pharynx: Oropharynx is clear.  Eyes:     Conjunctiva/sclera: Conjunctivae normal.  Cardiovascular:     Rate and Rhythm: Normal rate and regular rhythm.     Heart sounds: Normal heart sounds. No murmur heard.    No friction rub. No gallop.  Pulmonary:     Effort: Pulmonary effort is normal.     Breath sounds: Normal breath sounds. No wheezing, rhonchi or rales.  Musculoskeletal:     Cervical back: Neck supple.  Skin:    General: Skin is warm.     Findings: No rash.  Neurological:     Mental Status: She is alert and oriented to person, place, and time.  Psychiatric:        Behavior: Behavior normal.   The plan was reviewed with the patient/family, and all questions/concerned were addressed.  It was my pleasure to see Wendy Parker today and participate in her care. Please feel free to contact me with any questions or concerns.  Sincerely,  Orlan Cramp, DO Allergy & Immunology  [facility]  [facility] office: [phone] [facility] office: [phone]     [1] Allergies Allergen Reactions   Acetaminophen  Itching and Other (See Comments)    swelling  "

## 2024-08-14 ENCOUNTER — Encounter: Payer: Self-pay | Admitting: Allergy

## 2024-08-14 ENCOUNTER — Ambulatory Visit: Admitting: Allergy

## 2024-08-14 ENCOUNTER — Other Ambulatory Visit: Payer: Self-pay

## 2024-08-14 VITALS — BP 138/110 | HR 84 | Temp 98.2°F | Resp 14 | Ht 66.5 in | Wt 314.4 lb

## 2024-08-14 DIAGNOSIS — J3089 Other allergic rhinitis: Secondary | ICD-10-CM | POA: Diagnosis not present

## 2024-08-14 DIAGNOSIS — J4489 Other specified chronic obstructive pulmonary disease: Secondary | ICD-10-CM | POA: Diagnosis not present

## 2024-08-14 DIAGNOSIS — J449 Chronic obstructive pulmonary disease, unspecified: Secondary | ICD-10-CM | POA: Diagnosis not present

## 2024-08-14 DIAGNOSIS — R030 Elevated blood-pressure reading, without diagnosis of hypertension: Secondary | ICD-10-CM | POA: Diagnosis not present

## 2024-08-14 MED ORDER — ALBUTEROL SULFATE HFA 108 (90 BASE) MCG/ACT IN AERS: 2.0000 | INHALATION_SPRAY | RESPIRATORY_TRACT | 1 refills | Status: AC | PRN

## 2024-08-14 NOTE — Patient Instructions (Addendum)
 Breathing Spacer given and demonstrated proper use with inhaler. Patient understood technique and all questions/concerned were addressed.  Daily controller medication(s): continue Breztri 2 puffs twice a day with spacer and rinse mouth afterwards. May use albuterol  rescue inhaler 2 puffs every 4 to 6 hours as needed for shortness of breath, chest tightness, coughing, and wheezing.  Monitor frequency of use - if you need to use it more than twice per week on a consistent basis let us  know.  Breathing control goals:  Full participation in all desired activities (may need albuterol  before activity) Albuterol  use two times or less a week on average (not counting use with activity) Cough interfering with sleep two times or less a month Oral steroids no more than once a year No hospitalizations   Environmental  Get labs to check for environmental allergies. If negative will need to do skin testing next.  See below for environmental control measures.   Elevated blood pressure  Blood pressure reading was high in our office today. Vitals:   08/14/24 0927  BP: (!) 138/110   Please follow up with PCP regarding this.    Follow up in 2 months to check on your breathing.  Mold Control Mold and fungi can grow on a variety of surfaces provided certain temperature and moisture conditions exist.  Outdoor molds grow on plants, decaying vegetation and soil. The major outdoor mold, Alternaria and Cladosporium, are found in very high numbers during hot and dry conditions. Generally, a late summer - fall peak is seen for common outdoor fungal spores. Rain will temporarily lower outdoor mold spore count, but counts rise rapidly when the rainy period ends. The most important indoor molds are Aspergillus and Penicillium. Dark, humid and poorly ventilated basements are ideal sites for mold growth. The next most common sites of mold growth are the bathroom and the kitchen. Outdoor (Seasonal) Mold Control Use air  conditioning and keep windows closed. Avoid exposure to decaying vegetation. Avoid leaf raking. Avoid grain handling. Consider wearing a face mask if working in moldy areas.  Indoor (Perennial) Mold Control  Maintain humidity below 50%. Get rid of mold growth on hard surfaces with water, detergent and, if necessary, 5% bleach (do not mix with other cleaners). Then dry the area completely. If mold covers an area more than 10 square feet, consider hiring an indoor environmental professional. For clothing, washing with soap and water is best. If moldy items cannot be cleaned and dried, throw them away. Remove sources e.g. contaminated carpets. Repair and seal leaking roofs or pipes. Using dehumidifiers in damp basements may be helpful, but empty the water and clean units regularly to prevent mildew from forming. All rooms, especially basements, bathrooms and kitchens, require ventilation and cleaning to deter mold and mildew growth. Avoid carpeting on concrete or damp floors, and storing items in damp areas.

## 2024-08-17 LAB — ALLERGEN PROFILE WITH TOTAL IGE, RESPIRATORY-AREA 2
Alternaria Alternata IgE: 0.43 kU/L — AB
Aspergillus Fumigatus IgE: 0.12 kU/L — AB
Bermuda Grass IgE: 0.2 kU/L — AB
Cat Dander IgE: <0.1 kU/L
Cedar, Mountain IgE: 0.25 kU/L — AB
Cladosporium Herbarum IgE: <0.1 kU/L
Cockroach, German IgE: 0.22 kU/L — AB
Common Silver Birch IgE: 0.12 kU/L — AB
Cottonwood IgE: 0.15 kU/L — AB
D Farinae IgE: 0.8 kU/L — AB
D Pteronyssinus IgE: 0.87 kU/L — AB
Dog Dander IgE: <0.1 kU/L
Elm, American IgE: 0.14 kU/L — AB
IgE (Immunoglobulin E), Serum: 127 [IU]/mL (ref 6–495)
Johnson Grass IgE: 0.18 kU/L — AB
Maple/Box Elder IgE: 0.16 kU/L — AB
Mouse Urine IgE: <0.1 kU/L
Oak, White IgE: 0.13 kU/L — AB
Pecan, Hickory IgE: 0.15 kU/L — AB
Penicillium Chrysogen IgE: <0.1 kU/L
Pigweed, Rough IgE: <0.1 kU/L
Ragweed, Short IgE: 0.38 kU/L — AB
Sheep Sorrel IgE Qn: 0.14 kU/L — AB
Timothy Grass IgE: 0.43 kU/L — AB
White Mulberry IgE: <0.1 kU/L

## 2024-08-17 LAB — CBC WITH DIFFERENTIAL/PLATELET
Basophils Absolute: 0 10*3/uL (ref 0.0–0.2)
Basos: 0 %
EOS (ABSOLUTE): 0.1 10*3/uL (ref 0.0–0.4)
Eos: 2 %
Hematocrit: 40.6 % (ref 34.0–46.6)
Hemoglobin: 12.9 g/dL (ref 11.1–15.9)
Immature Grans (Abs): 0 10*3/uL (ref 0.0–0.1)
Immature Granulocytes: 0 %
Lymphocytes Absolute: 1.1 10*3/uL (ref 0.7–3.1)
Lymphs: 20 %
MCH: 25.4 pg — ABNORMAL LOW (ref 26.6–33.0)
MCHC: 31.8 g/dL (ref 31.5–35.7)
MCV: 80 fL (ref 79–97)
Monocytes Absolute: 0.3 10*3/uL (ref 0.1–0.9)
Monocytes: 6 %
Neutrophils Absolute: 3.8 10*3/uL (ref 1.4–7.0)
Neutrophils: 72 %
Platelets: 220 10*3/uL (ref 150–450)
RBC: 5.08 x10E6/uL (ref 3.77–5.28)
RDW: 14.7 % (ref 11.7–15.4)
WBC: 5.3 10*3/uL (ref 3.4–10.8)

## 2024-08-20 ENCOUNTER — Ambulatory Visit: Payer: Self-pay | Admitting: Allergy

## 2024-08-21 ENCOUNTER — Encounter: Payer: Self-pay | Admitting: Nurse Practitioner

## 2024-12-11 ENCOUNTER — Ambulatory Visit: Payer: Self-pay
